# Patient Record
Sex: Male | Born: 1985 | Race: White | Hispanic: No | Marital: Single | Smoking: Never smoker
Health system: Southern US, Community
[De-identification: ages and names within clinical notes are randomized; demographics above are authoritative.]

## PROBLEM LIST (undated history)

## (undated) DIAGNOSIS — F4310 Post-traumatic stress disorder, unspecified: Secondary | ICD-10-CM

## (undated) DIAGNOSIS — F32A Depression, unspecified: Secondary | ICD-10-CM

## (undated) DIAGNOSIS — G43009 Migraine without aura, not intractable, without status migrainosus: Secondary | ICD-10-CM

## (undated) DIAGNOSIS — F909 Attention-deficit hyperactivity disorder, unspecified type: Secondary | ICD-10-CM

## (undated) DIAGNOSIS — F0781 Postconcussional syndrome: Secondary | ICD-10-CM

## (undated) HISTORY — PX: SINUSOTOMY: SHX291

## (undated) HISTORY — DX: Attention-deficit hyperactivity disorder, unspecified type: F90.9

## (undated) HISTORY — DX: Postconcussional syndrome: F07.81

## (undated) HISTORY — DX: Migraine without aura, not intractable, without status migrainosus: G43.009

---

## 2019-07-28 DIAGNOSIS — F0781 Postconcussional syndrome: Secondary | ICD-10-CM | POA: Insufficient documentation

## 2019-08-03 ENCOUNTER — Ambulatory Visit
Admission: RE | Admit: 2019-08-03 | Discharge: 2019-08-03 | Disposition: A | Discharge status: Home / Self Care | Payer: BC Managed Care – PPO | Source: Ambulatory Visit | Attending: Family Medicine | Admitting: Family Medicine

## 2019-08-03 ENCOUNTER — Ambulatory Visit
Admission: RE | Admit: 2019-08-03 | Discharge: 2019-08-03 | Disposition: A | Discharge status: Home / Self Care | Payer: BC Managed Care – PPO | Attending: Family Medicine | Admitting: Family Medicine

## 2019-08-03 ENCOUNTER — Other Ambulatory Visit: Payer: Self-pay

## 2019-08-03 ENCOUNTER — Other Ambulatory Visit: Payer: Self-pay | Admitting: Family Medicine

## 2019-08-03 DIAGNOSIS — S199XXD Unspecified injury of neck, subsequent encounter: Secondary | ICD-10-CM | POA: Diagnosis present

## 2019-08-05 ENCOUNTER — Other Ambulatory Visit: Payer: Self-pay | Admitting: Acute Care

## 2019-08-05 ENCOUNTER — Other Ambulatory Visit (HOSPITAL_COMMUNITY): Payer: Self-pay | Admitting: Acute Care

## 2019-08-05 DIAGNOSIS — M542 Cervicalgia: Secondary | ICD-10-CM

## 2019-08-05 DIAGNOSIS — F0781 Postconcussional syndrome: Secondary | ICD-10-CM

## 2019-08-17 ENCOUNTER — Other Ambulatory Visit: Payer: Self-pay

## 2019-08-17 ENCOUNTER — Ambulatory Visit
Admission: RE | Admit: 2019-08-17 | Discharge: 2019-08-17 | Disposition: A | Discharge status: Home / Self Care | Payer: BC Managed Care – PPO | Source: Ambulatory Visit | Attending: Acute Care | Admitting: Acute Care

## 2019-08-17 DIAGNOSIS — F0781 Postconcussional syndrome: Secondary | ICD-10-CM | POA: Diagnosis present

## 2019-08-17 DIAGNOSIS — M542 Cervicalgia: Secondary | ICD-10-CM | POA: Insufficient documentation

## 2019-08-22 ENCOUNTER — Ambulatory Visit: Payer: BC Managed Care – PPO

## 2019-08-24 ENCOUNTER — Ambulatory Visit: Payer: BC Managed Care – PPO | Attending: Neurology

## 2019-08-24 ENCOUNTER — Other Ambulatory Visit: Payer: Self-pay

## 2019-08-24 VITALS — BP 126/84 | HR 84

## 2019-08-24 DIAGNOSIS — R41840 Attention and concentration deficit: Secondary | ICD-10-CM | POA: Insufficient documentation

## 2019-08-24 DIAGNOSIS — R202 Paresthesia of skin: Secondary | ICD-10-CM | POA: Diagnosis present

## 2019-08-24 DIAGNOSIS — M542 Cervicalgia: Secondary | ICD-10-CM | POA: Diagnosis present

## 2019-08-25 NOTE — Therapy (Deleted)
[place] [facility] PHYSICAL AND SPORTS MEDICINE [address] [place], [address] Phone: [phone]   Fax:  [phone]  Physical Therapy Evaluation  Patient Details  Name: Carlos Stewart MRN: [id number] Date of Birth: September 06, 1986 Referring Provider (PT): Theora Master, MD   Encounter Date: 08/24/2019    No past medical history on file.  *** The histories are not reviewed yet. Please review them in the "History" navigator section and refresh this SmartLink.  There were no vitals filed for this visit.       [facility] PT Assessment - 08/25/19 0001      Assessment   Medical Diagnosis  Post-concussion syndrome    Referring Provider (PT)  Theora Master, MD    Onset Date/Surgical Date  07/19/19    Hand Dominance  Right    Prior Therapy  None      Precautions   Precautions  None      Restrictions   Weight Bearing Restrictions  No      Balance Screen   Has the patient fallen in the past 6 months  No    Has the patient had a decrease in activity level because of a fear of falling?   Yes    Is the patient reluctant to leave their home because of a fear of falling?   No      Home Public house manager residence      Prior Function   Level of Independence  Independent    Vocation  Student      Cognition   Overall Cognitive Status  Within Functional Limits for tasks assessed         PAIN Location: neck N/T: on cervical UPAs rotation R C7-C3, to "area between shoulder blades"  Severity: 0/10 Irritability: mild  Nature: NMSK Stage: subacute Stability: ISQ  Agg: prolonged walking, focus Ease: rest   POSTURE Elevated shoulders B   GAIT / MOBILITY / TRANSFERS WNL  FUNCTIONAL MOBILITY Walk tol: 6 min Squat:  Reach:  Sleep: no disturbance Stairs: OK Falls: OK  VITALS HR: 84 O2: 100 BP: 126/84   ROM / MMT   Cervical ROM     Flex 100%  Ext. 75%!  Side-bend R 75%!  Side-bend  L 75%!  Rotation R 75%!  Rotation L 100%  ! = Painful  NEUROLOGICAL Visual field: WNL Vergence: WNL Saccades: mild deficit R when looking L VOR: WNL VOR cancellation: deferred due to cervical pain with rotation  Note patient c/o impaired focus, "fogginess"  SCAT 5  Part 2: Symptoms: Headache: 2 Pressure: 1 Feeling slowed down: 2 "In a fog": 1 Difficulty concentrating: 1 # Symptoms 5 Severity: 7  Part 3: Cognitive Screening Orientation: 5/5 Immediate memory: 14/15 Concentration 4/5  Part 4: Neurological Screen Read aloud: Y Full pain-free cervical ROM: N Double vision: N Finger-to-nose: Y Tandem gait: Y Balance exam: hard floor, tennis shoes mBESS: 3 errors in SLS  Part 5: Delayed Recall 2/5   PAM C2 WNL C3-C7 rotation to R painful and provokes N/T to "area between shoulder blades".   SPECIAL TESTS Spurling's - Alar ligament -   PALPATION TTP 1+ over cervical paraspinals, splenius capitus, upper trapezius   OUTCOMES Treadmill stress test: Pt reports onset of symptoms after 6 min walking. Vitals remain stable throughout. mBESS: 27/30    TREATMENT  Provided pt education on relative rest, avoidance of screen time and alcohol. 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 Saccade exercise (horizontal) x 10    Patient will benefit from skilled therapeutic intervention in order to improve the following deficits and impairments:  Pain, Decreased mobility, Impaired sensation, Impaired vision/preception, Decreased activity tolerance, Dizziness  Visit Diagnosis: Cervicalgia  Attention and concentration deficit  Paresthesia of skin     Problem List There are no active problems to display for this patient.   Virgia Land 08/25/2019, 6:25 PM  [place] PHYSICAL AND SPORTS MEDICINE [address] [place], [address] Phone: [phone]   Fax:   [phone]  Name: Carlos Stewart MRN: [id number] Date of Birth: 02/20/1986
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
## 2019-08-31 ENCOUNTER — Ambulatory Visit: Payer: BC Managed Care – PPO

## 2019-08-31 ENCOUNTER — Other Ambulatory Visit: Payer: Self-pay

## 2019-08-31 VITALS — BP 130/96 | HR 115

## 2019-08-31 DIAGNOSIS — R202 Paresthesia of skin: Secondary | ICD-10-CM

## 2019-08-31 DIAGNOSIS — R41840 Attention and concentration deficit: Secondary | ICD-10-CM

## 2019-08-31 DIAGNOSIS — M542 Cervicalgia: Secondary | ICD-10-CM | POA: Diagnosis not present
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
## 2019-09-14 ENCOUNTER — Ambulatory Visit: Payer: BC Managed Care – PPO | Attending: Neurology

## 2019-09-14 ENCOUNTER — Other Ambulatory Visit: Payer: Self-pay

## 2019-09-14 DIAGNOSIS — M542 Cervicalgia: Secondary | ICD-10-CM | POA: Diagnosis present

## 2019-09-14 DIAGNOSIS — R202 Paresthesia of skin: Secondary | ICD-10-CM | POA: Diagnosis present

## 2019-09-14 DIAGNOSIS — R41840 Attention and concentration deficit: Secondary | ICD-10-CM | POA: Insufficient documentation
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
## 2019-09-16 ENCOUNTER — Other Ambulatory Visit: Payer: Self-pay | Admitting: Neurology

## 2019-09-16 DIAGNOSIS — F0781 Postconcussional syndrome: Secondary | ICD-10-CM

## 2019-09-20 ENCOUNTER — Other Ambulatory Visit: Payer: Self-pay | Admitting: Family Medicine

## 2019-09-20 DIAGNOSIS — M5412 Radiculopathy, cervical region: Secondary | ICD-10-CM

## 2019-09-20 DIAGNOSIS — M5030 Other cervical disc degeneration, unspecified cervical region: Secondary | ICD-10-CM

## 2019-09-21 ENCOUNTER — Ambulatory Visit: Payer: BC Managed Care – PPO

## 2019-09-21 ENCOUNTER — Other Ambulatory Visit: Payer: Self-pay

## 2019-09-21 DIAGNOSIS — R41840 Attention and concentration deficit: Secondary | ICD-10-CM

## 2019-09-21 DIAGNOSIS — M542 Cervicalgia: Secondary | ICD-10-CM

## 2019-09-21 DIAGNOSIS — R202 Paresthesia of skin: Secondary | ICD-10-CM
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
## 2019-09-25 ENCOUNTER — Ambulatory Visit
Admission: RE | Admit: 2019-09-25 | Discharge: 2019-09-25 | Disposition: A | Discharge status: Home / Self Care | Payer: BC Managed Care – PPO | Source: Ambulatory Visit | Attending: Neurology | Admitting: Neurology

## 2019-09-25 DIAGNOSIS — F0781 Postconcussional syndrome: Secondary | ICD-10-CM | POA: Diagnosis not present

## 2019-09-26 ENCOUNTER — Other Ambulatory Visit: Payer: Self-pay

## 2019-09-26 ENCOUNTER — Ambulatory Visit
Admission: RE | Admit: 2019-09-26 | Discharge: 2019-09-26 | Disposition: A | Discharge status: Home / Self Care | Payer: BC Managed Care – PPO | Source: Ambulatory Visit | Attending: Family Medicine | Admitting: Family Medicine

## 2019-09-26 DIAGNOSIS — M5030 Other cervical disc degeneration, unspecified cervical region: Secondary | ICD-10-CM

## 2019-09-26 DIAGNOSIS — M5412 Radiculopathy, cervical region: Secondary | ICD-10-CM

## 2019-09-26 MED ORDER — TRIAMCINOLONE ACETONIDE 40 MG/ML IJ SUSP (RADIOLOGY)
60.0000 mg | Freq: Once | INTRAMUSCULAR | Status: AC
  Administered 2019-09-26: 60 mg via EPIDURAL

## 2019-09-26 MED ORDER — IOPAMIDOL (ISOVUE-M 300) INJECTION 61%
1.0000 mL | Freq: Once | INTRAMUSCULAR | Status: AC | PRN
  Administered 2019-09-26: 1 mL via EPIDURAL

## 2019-09-26 NOTE — Discharge Instructions (Signed)

## 2019-09-27 ENCOUNTER — Ambulatory Visit: Payer: BC Managed Care – PPO

## 2019-09-27 VITALS — BP 132/96

## 2019-09-27 DIAGNOSIS — M542 Cervicalgia: Secondary | ICD-10-CM

## 2019-09-27 DIAGNOSIS — R41840 Attention and concentration deficit: Secondary | ICD-10-CM

## 2019-09-27 DIAGNOSIS — R202 Paresthesia of skin: Secondary | ICD-10-CM
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
## 2019-10-13 ENCOUNTER — Other Ambulatory Visit: Payer: Self-pay

## 2019-10-13 ENCOUNTER — Ambulatory Visit: Payer: BC Managed Care – PPO

## 2019-10-13 DIAGNOSIS — M542 Cervicalgia: Secondary | ICD-10-CM

## 2019-10-13 DIAGNOSIS — R41840 Attention and concentration deficit: Secondary | ICD-10-CM

## 2019-10-13 DIAGNOSIS — R202 Paresthesia of skin: Secondary | ICD-10-CM
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
## 2019-10-17 NOTE — Addendum Note (Signed)
Addended by: Bethanie Dicker on: 10/17/2019 11:53 AM   Modules accepted: Orders

## 2019-10-24 ENCOUNTER — Ambulatory Visit: Payer: BC Managed Care – PPO

## 2019-10-31 ENCOUNTER — Other Ambulatory Visit: Payer: Self-pay

## 2019-10-31 ENCOUNTER — Ambulatory Visit: Payer: BC Managed Care – PPO | Attending: Neurology

## 2019-10-31 DIAGNOSIS — R41840 Attention and concentration deficit: Secondary | ICD-10-CM | POA: Diagnosis present

## 2019-10-31 DIAGNOSIS — M542 Cervicalgia: Secondary | ICD-10-CM

## 2019-10-31 DIAGNOSIS — R202 Paresthesia of skin: Secondary | ICD-10-CM | POA: Diagnosis present
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
## 2019-11-02 ENCOUNTER — Other Ambulatory Visit (HOSPITAL_COMMUNITY): Payer: Self-pay | Admitting: Neurology

## 2019-11-02 ENCOUNTER — Other Ambulatory Visit: Payer: Self-pay | Admitting: Neurology

## 2019-11-02 DIAGNOSIS — R9089 Other abnormal findings on diagnostic imaging of central nervous system: Secondary | ICD-10-CM

## 2019-11-02 DIAGNOSIS — H532 Diplopia: Secondary | ICD-10-CM

## 2019-11-08 ENCOUNTER — Other Ambulatory Visit: Payer: Self-pay

## 2019-11-08 ENCOUNTER — Ambulatory Visit
Admission: RE | Admit: 2019-11-08 | Discharge: 2019-11-08 | Disposition: A | Discharge status: Home / Self Care | Payer: BC Managed Care – PPO | Source: Ambulatory Visit | Attending: Neurology | Admitting: Neurology

## 2019-11-08 DIAGNOSIS — H532 Diplopia: Secondary | ICD-10-CM

## 2019-11-08 DIAGNOSIS — R9089 Other abnormal findings on diagnostic imaging of central nervous system: Secondary | ICD-10-CM

## 2019-11-08 MED ORDER — GADOBUTROL 1 MMOL/ML IV SOLN
9.0000 mL | Freq: Once | INTRAVENOUS | Status: AC | PRN
  Administered 2019-11-08: 9 mL via INTRAVENOUS

## 2019-11-10 ENCOUNTER — Other Ambulatory Visit: Payer: Self-pay

## 2019-11-10 ENCOUNTER — Ambulatory Visit: Payer: BC Managed Care – PPO

## 2019-11-10 DIAGNOSIS — M542 Cervicalgia: Secondary | ICD-10-CM | POA: Diagnosis not present

## 2019-11-10 DIAGNOSIS — R41840 Attention and concentration deficit: Secondary | ICD-10-CM

## 2019-11-10 DIAGNOSIS — R202 Paresthesia of skin: Secondary | ICD-10-CM
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
## 2019-11-16 ENCOUNTER — Other Ambulatory Visit: Payer: Self-pay

## 2019-11-16 ENCOUNTER — Ambulatory Visit: Payer: BC Managed Care – PPO | Attending: Neurology

## 2019-11-16 DIAGNOSIS — R202 Paresthesia of skin: Secondary | ICD-10-CM | POA: Diagnosis present

## 2019-11-16 DIAGNOSIS — M542 Cervicalgia: Secondary | ICD-10-CM | POA: Diagnosis present

## 2019-11-16 DIAGNOSIS — R252 Cramp and spasm: Secondary | ICD-10-CM | POA: Insufficient documentation

## 2019-11-16 DIAGNOSIS — R41840 Attention and concentration deficit: Secondary | ICD-10-CM | POA: Insufficient documentation

## 2019-11-16 NOTE — Addendum Note (Signed)
Addended by: Bethanie Dicker on: 11/16/2019 07:20 PM   Modules accepted: Orders
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
## 2019-11-23 ENCOUNTER — Other Ambulatory Visit: Payer: Self-pay

## 2019-11-23 ENCOUNTER — Ambulatory Visit: Payer: BC Managed Care – PPO

## 2019-11-23 DIAGNOSIS — R41840 Attention and concentration deficit: Secondary | ICD-10-CM

## 2019-11-23 DIAGNOSIS — R202 Paresthesia of skin: Secondary | ICD-10-CM

## 2019-11-23 DIAGNOSIS — M542 Cervicalgia: Secondary | ICD-10-CM
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
## 2019-11-28 ENCOUNTER — Ambulatory Visit: Payer: BC Managed Care – PPO

## 2019-11-28 ENCOUNTER — Other Ambulatory Visit: Payer: Self-pay

## 2019-11-28 DIAGNOSIS — R41840 Attention and concentration deficit: Secondary | ICD-10-CM | POA: Diagnosis not present

## 2019-11-28 DIAGNOSIS — M542 Cervicalgia: Secondary | ICD-10-CM

## 2019-11-28 DIAGNOSIS — R202 Paresthesia of skin: Secondary | ICD-10-CM
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
## 2019-12-03 ENCOUNTER — Ambulatory Visit: Payer: BC Managed Care – PPO | Attending: Internal Medicine

## 2019-12-03 DIAGNOSIS — Z23 Encounter for immunization: Secondary | ICD-10-CM | POA: Insufficient documentation

## 2019-12-03 NOTE — Progress Notes (Signed)
   Covid-19 Vaccination Clinic  Name:  NUNZIO BANET    MRN: [id number] DOB: Jul 05, 1986  12/03/2019  Mr. Rabinovich was observed post Covid-19 immunization for 15 minutes without incidence. He was provided with Vaccine Information Sheet and instruction to access the V-Safe system.   Mr. Ayo was instructed to call 911 with any severe reactions post vaccine: Marland Kitchen Difficulty breathing  . Swelling of your face and throat  . A fast heartbeat  . A bad rash all over your body  . Dizziness and weakness    Immunizations Administered    Name Date Dose VIS Date Route   Pfizer COVID-19 Vaccine 12/03/2019  8:35 AM 0.3 mL 09/23/2019 Intramuscular   Manufacturer: [employer], [employer]   Lot: [id number]   NDC: [address]-8

## 2019-12-07 ENCOUNTER — Ambulatory Visit: Payer: BC Managed Care – PPO

## 2019-12-07 ENCOUNTER — Other Ambulatory Visit: Payer: Self-pay

## 2019-12-07 DIAGNOSIS — R41840 Attention and concentration deficit: Secondary | ICD-10-CM

## 2019-12-07 DIAGNOSIS — R252 Cramp and spasm: Secondary | ICD-10-CM
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
## 2019-12-12 ENCOUNTER — Other Ambulatory Visit: Payer: Self-pay

## 2019-12-12 ENCOUNTER — Ambulatory Visit: Payer: BC Managed Care – PPO | Attending: Neurology

## 2019-12-12 DIAGNOSIS — R252 Cramp and spasm: Secondary | ICD-10-CM | POA: Diagnosis present

## 2019-12-12 DIAGNOSIS — R42 Dizziness and giddiness: Secondary | ICD-10-CM | POA: Diagnosis present

## 2019-12-12 DIAGNOSIS — M542 Cervicalgia: Secondary | ICD-10-CM | POA: Diagnosis present

## 2019-12-12 DIAGNOSIS — R2681 Unsteadiness on feet: Secondary | ICD-10-CM | POA: Diagnosis present

## 2019-12-12 DIAGNOSIS — R41840 Attention and concentration deficit: Secondary | ICD-10-CM | POA: Diagnosis present
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
## 2019-12-14 ENCOUNTER — Ambulatory Visit: Payer: BC Managed Care – PPO

## 2019-12-19 ENCOUNTER — Ambulatory Visit: Payer: BC Managed Care – PPO

## 2019-12-19 ENCOUNTER — Other Ambulatory Visit: Payer: Self-pay

## 2019-12-19 DIAGNOSIS — R41840 Attention and concentration deficit: Secondary | ICD-10-CM

## 2019-12-19 DIAGNOSIS — R252 Cramp and spasm: Secondary | ICD-10-CM

## 2019-12-19 DIAGNOSIS — R2681 Unsteadiness on feet: Secondary | ICD-10-CM
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
## 2019-12-26 ENCOUNTER — Ambulatory Visit: Payer: BC Managed Care – PPO

## 2019-12-26 ENCOUNTER — Other Ambulatory Visit: Payer: Self-pay

## 2019-12-26 DIAGNOSIS — R42 Dizziness and giddiness: Secondary | ICD-10-CM

## 2019-12-26 DIAGNOSIS — R252 Cramp and spasm: Secondary | ICD-10-CM | POA: Diagnosis not present

## 2019-12-26 DIAGNOSIS — R2681 Unsteadiness on feet: Secondary | ICD-10-CM
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
## 2019-12-28 ENCOUNTER — Ambulatory Visit: Payer: BC Managed Care – PPO | Attending: Internal Medicine

## 2019-12-28 DIAGNOSIS — Z23 Encounter for immunization: Secondary | ICD-10-CM

## 2019-12-28 NOTE — Progress Notes (Signed)
   Covid-19 Vaccination Clinic  Name:  Carlos Stewart    MRN: [id number] DOB: 1986/09/07  12/28/2019  [name] was observed post Covid-19 immunization for 15 minutes without incident. He was provided with Vaccine Information Sheet and instruction to access the V-Safe system.   [name] was instructed to call 911 with any severe reactions post vaccine: Marland Kitchen Difficulty breathing  . Swelling of face and throat  . A fast heartbeat  . A bad rash all over body  . Dizziness and weakness   Immunizations Administered    Name Date Dose VIS Date Route   Pfizer COVID-19 Vaccine 12/28/2019  8:31 AM 0.3 mL 09/23/2019 Intramuscular   Manufacturer: [employer], [employer]   Lot: [id number]   NDC: [address]-2

## 2020-01-02 ENCOUNTER — Ambulatory Visit: Payer: BC Managed Care – PPO

## 2020-01-02 ENCOUNTER — Other Ambulatory Visit: Payer: Self-pay

## 2020-01-02 DIAGNOSIS — R42 Dizziness and giddiness: Secondary | ICD-10-CM

## 2020-01-02 DIAGNOSIS — R252 Cramp and spasm: Secondary | ICD-10-CM | POA: Diagnosis not present

## 2020-01-02 DIAGNOSIS — R2681 Unsteadiness on feet: Secondary | ICD-10-CM
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
## 2020-01-09 ENCOUNTER — Ambulatory Visit: Payer: BC Managed Care – PPO

## 2020-01-09 ENCOUNTER — Other Ambulatory Visit: Payer: Self-pay

## 2020-01-09 DIAGNOSIS — R2681 Unsteadiness on feet: Secondary | ICD-10-CM

## 2020-01-09 DIAGNOSIS — R252 Cramp and spasm: Secondary | ICD-10-CM | POA: Diagnosis not present

## 2020-01-09 DIAGNOSIS — R42 Dizziness and giddiness: Secondary | ICD-10-CM
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
## 2020-01-23 ENCOUNTER — Other Ambulatory Visit: Payer: Self-pay

## 2020-01-23 ENCOUNTER — Ambulatory Visit: Payer: BC Managed Care – PPO | Attending: Neurology

## 2020-01-23 DIAGNOSIS — R2681 Unsteadiness on feet: Secondary | ICD-10-CM

## 2020-01-23 DIAGNOSIS — R42 Dizziness and giddiness: Secondary | ICD-10-CM | POA: Diagnosis present

## 2020-01-23 DIAGNOSIS — R252 Cramp and spasm: Secondary | ICD-10-CM | POA: Diagnosis present
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
## 2020-02-07 ENCOUNTER — Ambulatory Visit: Payer: BC Managed Care – PPO

## 2020-02-07 ENCOUNTER — Other Ambulatory Visit: Payer: Self-pay

## 2020-02-07 DIAGNOSIS — R2681 Unsteadiness on feet: Secondary | ICD-10-CM

## 2020-02-07 DIAGNOSIS — R252 Cramp and spasm: Secondary | ICD-10-CM

## 2020-02-07 DIAGNOSIS — R42 Dizziness and giddiness: Secondary | ICD-10-CM | POA: Diagnosis not present
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
## 2020-02-20 ENCOUNTER — Ambulatory Visit: Payer: BC Managed Care – PPO | Attending: Neurology

## 2020-02-20 ENCOUNTER — Other Ambulatory Visit: Payer: Self-pay

## 2020-02-20 DIAGNOSIS — R42 Dizziness and giddiness: Secondary | ICD-10-CM | POA: Insufficient documentation

## 2020-02-20 DIAGNOSIS — M542 Cervicalgia: Secondary | ICD-10-CM | POA: Insufficient documentation

## 2020-02-20 DIAGNOSIS — R2681 Unsteadiness on feet: Secondary | ICD-10-CM | POA: Diagnosis present

## 2020-02-20 DIAGNOSIS — R41841 Cognitive communication deficit: Secondary | ICD-10-CM | POA: Diagnosis present
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
## 2020-02-29 ENCOUNTER — Ambulatory Visit: Payer: BC Managed Care – PPO | Admitting: Speech Pathology

## 2020-02-29 ENCOUNTER — Other Ambulatory Visit: Payer: Self-pay

## 2020-02-29 DIAGNOSIS — R42 Dizziness and giddiness: Secondary | ICD-10-CM | POA: Diagnosis not present

## 2020-02-29 DIAGNOSIS — R41841 Cognitive communication deficit: Secondary | ICD-10-CM

## 2020-03-01 ENCOUNTER — Encounter: Payer: Self-pay | Admitting: Speech Pathology

## 2020-03-01 ENCOUNTER — Other Ambulatory Visit: Payer: Self-pay

## 2020-03-01 NOTE — Therapy (Signed)
[facility] MAIN [facility] SERVICES [address] [place], [place], [address] Phone: [phone]   Fax:  [phone]  Speech Language Pathology Evaluation  Patient Details  Name: Carlos Stewart MRN: [id number] Date of Birth: May 29, 1986 Referring Provider (SLP): Dr. Malvin Johns   Encounter Date: 02/29/2020  End of Session - 03/01/20 1542    Visit Number  1    Number of Visits  17    Date for SLP Re-Evaluation  04/25/20    Authorization Type  [employer]    Authorization Time Period  Start 02/29/2020    Authorization - Visit Number  1    Authorization - Number of Visits  10    SLP Start Time  1000    SLP Stop Time   1045    SLP Time Calculation (min)  45 min    Activity Tolerance  Patient tolerated treatment well       History reviewed. No pertinent past medical history.  History reviewed. No pertinent surgical history.  There were no vitals filed for this visit.      SLP Evaluation [facility] - 03/01/20 0001      SLP Visit Information   SLP Received On  02/29/20    Referring Provider (SLP)  Dr. Malvin Johns    Onset Date  02/16/2020    Medical Diagnosis  Post-Concussion Syndrome      Subjective   Subjective  The patient is eager to heal from the concussion.    Patient/Family Stated Goal  Improved word finding, improved memory, improved reading comprehension      Pain Assessment   Currently in Pain?  No/denies      General Information   HPI  Carlos Stewart is a 34 year old man with post-concussion syndrome with residual visual disturbance, memory loss, and word finding deficits.      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      Cognition   Overall Cognitive Status  Impaired/Different from baseline      Auditory Comprehension   Overall Auditory Comprehension  Appears within functional limits for tasks assessed      Reading Comprehension   Reading Status  Impaired      Expression   Primary Mode of Expression  Verbal      Verbal Expression   Overall Verbal Expression  Impaired      Written Expression   Written Expression  Exceptions to [facility]      Oral Motor/Sensory Function   Overall Oral Motor/Sensory Function  Appears within functional limits for tasks assessed      Motor Speech   Overall Motor Speech  Appears within functional limits for tasks assessed      Standardized Assessments   Standardized Assessments   Montreal Cognitive Assessment (MOCA)       Montreal Cognitive Assessment (MOCA) Version: 8.1 Visuospatial/Executive Alternating trail making       1/1 Visuoconstruction Skills (copy 3-d design) 0/1 Draw a clock     3/3 Naming     3/3 Attention Forward digit span    0/1 Backward digit span    1/1 Vigilance     1/1 Serial 7's     3/3 Language  Verbal Fluency     1/1 Repetition     1/2 Abstraction     2/2 Delayed Recall    2/5  Memory Index Score  10/15 Orientation     6/6 TOTAL  24/30       Normal  ? 26/30   Additional Observations: The patient reports baseline ADD and mild dyslexia.  His reading has been affected- he has difficulty concentrating and learning new written information.  He is slower and less accurate with typing skills. Scored in the 80th percentile on the Word Fluency subtest from the [facility] Comprehensive Examination.  SLP Education - 03/01/20 1541    Education Details  Results, recommendations, POC    Person(s) Educated  Patient    Methods  Explanation    Comprehension  Verbalized understanding         SLP Long Term Goals - 03/01/20 1546      SLP LONG TERM GOAL #1   Title  The patient will complete word finding tasks with 80% accuracy and explain strategies to improve word finding.    Time  8    Period  Weeks    Status  New    Target Date  04/25/20      SLP LONG TERM GOAL #2   Title  The patient will complete complex memory activities with 80% accuracy.    Time  8    Period   Weeks    Status  New    Target Date  04/25/20      SLP LONG TERM GOAL #3   Title  The patient will demonstrate reading comprehension for complex written information using study strategies.    Time  8    Period  Weeks    Status  New    Target Date  04/25/20      SLP LONG TERM GOAL #4   Title  The patient will improve written output speed and accuracy using word processing program.    Time  8    Period  Weeks    Status  New    Target Date  04/25/20       Plan - 03/01/20 1544    Clinical Impression Statement  At 6 months post onset of concussion, the patient is presenting with mild cognitive communication deficits characterized by impairment of visuospatial skills, attention, word finding in conversation, and memory impairment.  The patient scored 24/30 on the [facility] Cognitive Assessment, which falls below the cut-off point of 27.  The word finding deficits appear to be secondary difficulty with attention/concentration vs. aphasia as he scored within the 80th percentile on the Word Fluency subtest from the [facility] Comprehensive Examination.  The patient reports that concentrating exacerbates his visual disturbance.  He is experiencing increased difficulty with reading comprehension and speed of written output.  He is currently a DPT graduate student and needs to be able to enter documentation in a timely and accurate manor.  The patient will benefit from skilled speech therapy for restorative and compensatory treatment of post-concussion symptoms.  Patient targets word finding, memory, reading comprehension, and writing.  The patient's symptoms appear to be significantly exacerbated by the visual disturbance and he would benefit from referral to a neuro-ophthalmologist.    Speech Therapy Frequency  2x / week    Duration  Other (comment)   8 weeks   Treatment/Interventions  Language facilitation;Cognitive reorganization;Patient/family education    Potential to Achieve Goals  Good     Potential Considerations  Ability to learn/carryover information;Family/community support;Pain level;Previous level of function;Cooperation/participation level;Severity of impairments;Medical prognosis    SLP Home Exercise Plan  Pt instructed to practice typing    Consulted and Agree with Plan of Care  Patient  Patient will benefit from skilled therapeutic intervention in order to improve the following deficits and impairments:   Cognitive communication deficit - Plan: SLP plan of care cert/re-cert    Problem List There are no problems to display for this patient.  [name], [place], Susie 03/01/2020, 3:50 PM  [place] MAIN [facility] SERVICES [address] [place], [place], [address] Phone: [phone]   Fax:  [phone]  Name: Carlos Stewart MRN: [id number] Date of Birth: 08/23/1986

## 2020-03-05 ENCOUNTER — Ambulatory Visit: Payer: BC Managed Care – PPO

## 2020-03-05 ENCOUNTER — Other Ambulatory Visit: Payer: Self-pay

## 2020-03-05 DIAGNOSIS — R42 Dizziness and giddiness: Secondary | ICD-10-CM | POA: Diagnosis not present

## 2020-03-05 DIAGNOSIS — M542 Cervicalgia: Secondary | ICD-10-CM

## 2020-03-05 DIAGNOSIS — R2681 Unsteadiness on feet: Secondary | ICD-10-CM

## 2020-03-06 ENCOUNTER — Encounter: Payer: BC Managed Care – PPO | Admitting: Speech Pathology
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
## 2020-03-07 ENCOUNTER — Other Ambulatory Visit: Payer: Self-pay

## 2020-03-07 ENCOUNTER — Ambulatory Visit: Payer: BC Managed Care – PPO | Admitting: Speech Pathology

## 2020-03-07 DIAGNOSIS — R42 Dizziness and giddiness: Secondary | ICD-10-CM | POA: Diagnosis not present

## 2020-03-07 DIAGNOSIS — R41841 Cognitive communication deficit: Secondary | ICD-10-CM

## 2020-03-08 ENCOUNTER — Encounter: Payer: Self-pay | Admitting: Speech Pathology

## 2020-03-08 NOTE — Therapy (Signed)
[place] MAIN [facility] SERVICES [address] [place], [place], [address] Phone: [phone]   Fax:  [phone]  Speech Language Pathology Treatment  Patient Details  Name: Carlos Stewart MRN: [id number] Date of Birth: [birth year] Referring Provider (SLP): Dr. Melrose Nakayama   Encounter Date: 03/07/2020  End of Session - 03/08/20 1104    Visit Number  2    Number of Visits  17    Date for SLP Re-Evaluation  04/25/20    Authorization Type  BCBS    Authorization Time Period  Start 02/29/2020    Authorization - Visit Number  2    Authorization - Number of Visits  10    SLP Start Time  1500    SLP Stop Time   1550    SLP Time Calculation (min)  50 min    Activity Tolerance  Patient tolerated treatment well       History reviewed. No pertinent past medical history.  History reviewed. No pertinent surgical history.  There were no vitals filed for this visit.  Subjective Assessment - 03/08/20 1103    Subjective  Patient shared that studies have been difficult. He was focused and determined to improve his study strategies.            ADULT SLP TREATMENT - 03/08/20 0001      General Information   Behavior/Cognition  Alert;Cooperative;Pleasant mood    HPI  Carlos Stewart is a 34 year old man with post-concussion syndrome with residual visual disturbance, memory loss, and word finding deficits.       Treatment Provided   Treatment provided  Cognitive-Linquistic      Pain Assessment   Pain Assessment  No/denies pain      Cognitive-Linquistic Treatment   Treatment focused on  Cognition;Aphasia    Skilled Treatment  MEMORY: Studied acronyms (f=4) and then recalled them to answer questions. Correctly answered 75% of questions. Generated acronyms given information. Recited facts (f=24) out loud to push info into long-term memory. Correctly answered 85% of questions. Patient noted that he knew the answers to most questions prior to the activity. WORD  FINDING: Filled out a semantic feature analysis diagram to generate attributes to a target object.      Assessment / Recommendations / Plan   Plan  Continue with current plan of care      Progression Toward Goals   Progression toward goals  Progressing toward goals       SLP Education - 03/08/20 1103    Education Details  Instructed patient on strategies for improving study techniques (keeping a journal, reciting things out loud, generating acronyms, pulling important info from lectures).    Person(s) Educated  Patient    Methods  Explanation    Comprehension  Verbalized understanding         SLP Long Term Goals - 03/01/20 1546      SLP LONG TERM GOAL #1   Title  The patient will complete word finding tasks with 80% accuracy and explain strategies to improve word finding.    Time  8    Period  Weeks    Status  New    Target Date  04/25/20      SLP LONG TERM GOAL #2   Title  The patient will complete complex memory activities with 80% accuracy.    Time  8    Period  Weeks    Status  New    Target Date  04/25/20  SLP LONG TERM GOAL #3   Title  The patient will demonstrate reading comprehension for complex written information using study strategies.    Time  8    Period  Weeks    Status  New    Target Date  04/25/20      SLP LONG TERM GOAL #4   Title  The patient will improve written output speed and accuracy using word processing program.    Time  8    Period  Weeks    Status  New    Target Date  04/25/20       Plan - 03/08/20 1105    Clinical Impression Statement  Patient is aware of the strategies that have worked well for him in the past. He had difficulty with word retrieval, but after a couple minutes thought of the word on his own. Eager to practice the strategies discussed throughout the week.    Speech Therapy Frequency  2x / week    Duration  Other (comment)    Treatment/Interventions  Language facilitation;Cognitive reorganization;Patient/family  education    Potential to Achieve Goals  Good    Potential Considerations  Ability to learn/carryover information;Family/community support;Pain level;Previous level of function;Cooperation/participation level;Severity of impairments;Medical prognosis    Consulted and Agree with Plan of Care  Patient       Patient will benefit from skilled therapeutic intervention in order to improve the following deficits and impairments:   Cognitive communication deficit    Problem List There are no problems to display for this patient.  Dollene Primrose, MS/CCC- SLP  Leandrew Koyanagi 03/08/2020, 11:06 AM  [place] [facility] MAIN [facility] SERVICES [address] [place], [place], [address] Phone: [phone]   Fax:  [phone]   Name: Carlos Stewart MRN: [id number] Date of Birth: 12/01/1985

## 2020-03-09 ENCOUNTER — Ambulatory Visit: Payer: BC Managed Care – PPO | Admitting: Speech Pathology

## 2020-03-09 ENCOUNTER — Encounter: Payer: Self-pay | Admitting: Speech Pathology

## 2020-03-09 ENCOUNTER — Other Ambulatory Visit: Payer: Self-pay

## 2020-03-09 DIAGNOSIS — R41841 Cognitive communication deficit: Secondary | ICD-10-CM

## 2020-03-09 DIAGNOSIS — R42 Dizziness and giddiness: Secondary | ICD-10-CM | POA: Diagnosis not present

## 2020-03-09 NOTE — Therapy (Signed)
[place] [facility] MAIN [facility] SERVICES [address] [place], [place], [address] Phone: [phone]   Fax:  [phone]  Speech Language Pathology Treatment  Patient Details  Name: Carlos Stewart MRN: [id number] Date of Birth: 1986/03/28 Referring Provider (SLP): Dr. Malvin Johns   Encounter Date: 03/09/2020  End of Session - 03/09/20 1114    Visit Number  3    Number of Visits  17    Date for SLP Re-Evaluation  04/25/20    Authorization Type  BCBS    Authorization Time Period  Start 02/29/2020    Authorization - Visit Number  3    Authorization - Number of Visits  10    SLP Start Time  1000    SLP Stop Time   1050    SLP Time Calculation (min)  50 min    Activity Tolerance  Patient tolerated treatment well       History reviewed. No pertinent past medical history.  History reviewed. No pertinent surgical history.  There were no vitals filed for this visit.  Subjective Assessment - 03/09/20 1113    Subjective  Eager to learn and develop study strategies. "My typing still feels very slow. I used to be able to type and comprehend at the same time."            ADULT SLP TREATMENT - 03/09/20 0001      General Information   Behavior/Cognition  Alert;Cooperative;Pleasant mood    HPI  Kriss Ishler is a 34 year old man with post-concussion syndrome with residual visual disturbance, memory loss, and word finding deficits.       Treatment Provided   Treatment provided  Cognitive-Linquistic      Pain Assessment   Pain Assessment  No/denies pain      Cognitive-Linquistic Treatment   Treatment focused on  Cognition;Aphasia    Skilled Treatment  MEMORY: Read phrases aloud and used imaging as a study strategy. Answered follow-up questions at 100% accuracy. Mentioned that coming up with mental images is one of his strengths. Typed notes from material read out loud. Needed to repeat 2/10 passages in order to note all important information. Completed 2  semantic feature analyses.      Assessment / Recommendations / Plan   Plan  Continue with current plan of care      Progression Toward Goals   Progression toward goals  Progressing toward goals       SLP Education - 03/09/20 1114    Education Details  Study strategies, practice typing will improve speed    Person(s) Educated  Patient    Methods  Explanation    Comprehension  Verbalized understanding         SLP Long Term Goals - 03/01/20 1546      SLP LONG TERM GOAL #1   Title  The patient will complete word finding tasks with 80% accuracy and explain strategies to improve word finding.    Time  8    Period  Weeks    Status  New    Target Date  04/25/20      SLP LONG TERM GOAL #2   Title  The patient will complete complex memory activities with 80% accuracy.    Time  8    Period  Weeks    Status  New    Target Date  04/25/20      SLP LONG TERM GOAL #3   Title  The patient will demonstrate reading comprehension for complex  written information using study strategies.    Time  8    Period  Weeks    Status  New    Target Date  04/25/20      SLP LONG TERM GOAL #4   Title  The patient will improve written output speed and accuracy using word processing program.    Time  8    Period  Weeks    Status  New    Target Date  04/25/20       Plan - 03/09/20 1115    Clinical Impression Statement  Patient is proficient in demonstrating short-term memory, especially when using the study strategies that are discussed. Has applied these strategies to his studies throughout the week. He is aware that he needs to practice typing at home to improve speed.    Speech Therapy Frequency  2x / week    Duration  Other (comment)    Treatment/Interventions  Language facilitation;Cognitive reorganization;Patient/family education    Potential to Achieve Goals  Good    Potential Considerations  Ability to learn/carryover information;Family/community support;Pain level;Previous level of  function;Cooperation/participation level;Severity of impairments;Medical prognosis    Consulted and Agree with Plan of Care  Patient       Patient will benefit from skilled therapeutic intervention in order to improve the following deficits and impairments:   Cognitive communication deficit    Problem List There are no problems to display for this patient.  [name], MS/CCC- SLP  Lou Miner 03/09/2020, 11:15 AM  [place] MAIN [facility] SERVICES [address] [place], [place], [address] Phone: [phone]   Fax:  [phone]   Name: Carlos Stewart MRN: [id number] Date of Birth: 04-23-1986

## 2020-03-13 ENCOUNTER — Other Ambulatory Visit: Payer: Self-pay

## 2020-03-13 ENCOUNTER — Ambulatory Visit: Payer: BC Managed Care – PPO | Attending: Neurology | Admitting: Speech Pathology

## 2020-03-13 DIAGNOSIS — R42 Dizziness and giddiness: Secondary | ICD-10-CM | POA: Diagnosis present

## 2020-03-13 DIAGNOSIS — R41841 Cognitive communication deficit: Secondary | ICD-10-CM | POA: Diagnosis present

## 2020-03-13 DIAGNOSIS — R2681 Unsteadiness on feet: Secondary | ICD-10-CM | POA: Diagnosis present

## 2020-03-13 DIAGNOSIS — M542 Cervicalgia: Secondary | ICD-10-CM | POA: Insufficient documentation

## 2020-03-13 DIAGNOSIS — R252 Cramp and spasm: Secondary | ICD-10-CM | POA: Insufficient documentation

## 2020-03-14 ENCOUNTER — Encounter: Payer: Self-pay | Admitting: Speech Pathology

## 2020-03-14 NOTE — Therapy (Signed)
[place] MAIN [facility] SERVICES [address] [place], [place], [address] Phone: [phone]   Fax:  [phone]  Speech Language Pathology Treatment  Patient Details  Name: Carlos Stewart MRN: [id number] Date of Birth: 12/01/1985 Referring Provider (SLP): Dr. Melrose Nakayama   Encounter Date: 03/13/2020  End of Session - 03/14/20 0912    Visit Number  4    Number of Visits  17    Date for SLP Re-Evaluation  04/25/20    Authorization Type  BCBS    Authorization Time Period  Start 02/29/2020    Authorization - Visit Number  4    Authorization - Number of Visits  10    SLP Start Time  0400    SLP Stop Time   6962    SLP Time Calculation (min)  45 min    Activity Tolerance  Patient tolerated treatment well       History reviewed. No pertinent past medical history.  History reviewed. No pertinent surgical history.  There were no vitals filed for this visit.  Subjective Assessment - 03/14/20 0912    Subjective  Patient was focused and actively participating. Patient expressed difficulty with word finding when he does not have notes in front of him to refer to.            ADULT SLP TREATMENT - 03/14/20 0001      General Information   Behavior/Cognition  Alert;Cooperative;Pleasant mood    HPI  Carlos Stewart is a 34 year old man with post-concussion syndrome with residual visual disturbance, memory loss, and word finding deficits.       Treatment Provided   Treatment provided  Cognitive-Linquistic      Pain Assessment   Pain Assessment  No/denies pain      Cognitive-Linquistic Treatment   Treatment focused on  Cognition;Aphasia    Skilled Treatment  WORD RETRIEVAL: Identified actions from short description at 100% independence, but said he had difficulty thinking of several of the words. Did same exercise with PT terms, answered 95% correctly. TYPING: Completed five typing exercises to establish baseline word per minute measure. Averaged 31.6  WPM with 90.6% accuracy. Typed notes from a passage read aloud for 5 minutes. Answer follow-up questions related to the passage at 75% accuracy.      Assessment / Recommendations / Plan   Plan  Continue with current plan of care      Progression Toward Goals   Progression toward goals  Progressing toward goals       SLP Education - 03/14/20 0912    Education Details  Continue practice to improve typing speed    Person(s) Educated  Patient         SLP Long Term Goals - 03/01/20 1546      SLP LONG TERM GOAL #1   Title  The patient will complete word finding tasks with 80% accuracy and explain strategies to improve word finding.    Time  8    Period  Weeks    Status  New    Target Date  04/25/20      SLP LONG TERM GOAL #2   Title  The patient will complete complex memory activities with 80% accuracy.    Time  8    Period  Weeks    Status  New    Target Date  04/25/20      SLP LONG TERM GOAL #3   Title  The patient will demonstrate reading comprehension for complex  written information using study strategies.    Time  8    Period  Weeks    Status  New    Target Date  04/25/20      SLP LONG TERM GOAL #4   Title  The patient will improve written output speed and accuracy using word processing program.    Time  8    Period  Weeks    Status  New    Target Date  04/25/20       Plan - 03/14/20 0913    Clinical Impression Statement  Patient demonstrated proficient word retrieval skills, but expressed that he would still like to increase his speed of thinking. Expressed that he has more difficulty finding words when he cant refer to notes in front of him. His baseline levels for typing speed in words per minute were recorded. Will continue to practice and measure progress.    Speech Therapy Frequency  2x / week    Duration  Other (comment)    Treatment/Interventions  Language facilitation;Cognitive reorganization;Patient/family education    Potential Considerations  Ability  to learn/carryover information;Family/community support;Pain level;Previous level of function;Cooperation/participation level;Severity of impairments;Medical prognosis    SLP Home Exercise Plan  Pt instructed to practice typing    Consulted and Agree with Plan of Care  Patient       Patient will benefit from skilled therapeutic intervention in order to improve the following deficits and impairments:   Cognitive communication deficit    Problem List There are no problems to display for this patient.   Cephus Slater, Student Intern 03/14/2020, 9:14 AM  [place] [facility] MAIN [facility] SERVICES [address] [place], [place], [address] Phone: [phone]   Fax:  [phone]   Name: Carlos Stewart MRN: [id number] Date of Birth: 06-Mar-1986

## 2020-03-20 ENCOUNTER — Encounter: Payer: Self-pay | Admitting: Speech Pathology

## 2020-03-20 ENCOUNTER — Other Ambulatory Visit: Payer: Self-pay

## 2020-03-20 ENCOUNTER — Ambulatory Visit: Payer: BC Managed Care – PPO | Admitting: Speech Pathology

## 2020-03-20 DIAGNOSIS — R41841 Cognitive communication deficit: Secondary | ICD-10-CM

## 2020-03-20 NOTE — Therapy (Signed)
[place] [facility] MAIN [facility] SERVICES [address] [place], [place], [address] Phone: [phone]   Fax:  [phone]  Speech Language Pathology Treatment  Patient Details  Name: Carlos Stewart MRN: [id number] Date of Birth: 07/06/1986 Referring Provider (SLP): Dr. Malvin Johns   Encounter Date: 03/20/2020  End of Session - 03/20/20 1711    Visit Number  5    Number of Visits  17    Date for SLP Re-Evaluation  04/25/20    Authorization Type  BCBS    Authorization Time Period  Start 02/29/2020    Authorization - Visit Number  5    Authorization - Number of Visits  10    SLP Start Time  0405    SLP Stop Time   0450    SLP Time Calculation (min)  45 min    Activity Tolerance  Patient tolerated treatment well       History reviewed. No pertinent past medical history.  History reviewed. No pertinent surgical history.  There were no vitals filed for this visit.  Subjective Assessment - 03/20/20 1711    Subjective  Focused, brought in work from classes            ADULT SLP TREATMENT - 03/20/20 0001      General Information   Behavior/Cognition  Alert;Cooperative;Pleasant mood    HPI  Carlos Stewart is a 34 year old man with post-concussion syndrome with residual visual disturbance, memory loss, and word finding deficits.       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 Expressed some difficulty with word finding and difficulty remembering complex names (e.g. decerebellate rigidity). Came up with strategies to associate terms with cues he will remember. Taught same concepts again at the end of session without notes and was able to retrieve words using the  strategies he came up with. Completed word deduction activity. Identified word being described given 3 clues in 95% of opportunities.      Assessment / Recommendations / Plan   Plan  Continue with current plan of care      Progression Toward Goals   Progression toward goals  Progressing toward goals       SLP Education - 03/20/20 1711    Education Details  Use study strategies to associate more complex words with common ideas that are easier to remember    Person(s) Educated  Patient    Methods  Explanation    Comprehension  Verbalized understanding         SLP Long Term Goals - 03/01/20 1546      SLP LONG TERM GOAL #1   Title  The patient will complete word finding tasks with 80% accuracy and explain strategies to improve word finding.    Time  8    Period  Weeks    Status  New    Target Date  04/25/20      SLP LONG TERM GOAL #2   Title  The patient will complete complex memory activities with 80% accuracy.    Time  8    Period  Weeks    Status  New    Target Date  04/25/20  SLP LONG TERM GOAL #3   Title  The patient will demonstrate reading comprehension for complex written information using study strategies.    Time  8    Period  Weeks    Status  New    Target Date  04/25/20      SLP LONG TERM GOAL #4   Title  The patient will improve written output speed and accuracy using word processing program.    Time  8    Period  Weeks    Status  New    Target Date  04/25/20       Plan - 03/20/20 1712    Clinical Impression Statement  Patient expressed that study strategies have been helping with remembering information throughout the week. He came up with one strategy on his own before session and 2 more during the session. Still expressing difficulty with some word finding. Will continue to build confidence and speaking and teaching concepts.    Speech Therapy Frequency  2x / week    Duration  Other (comment)    Treatment/Interventions  Language  facilitation;Cognitive reorganization;Patient/family education    Potential to Achieve Goals  Good    Potential Considerations  Ability to learn/carryover information;Family/community support;Pain level;Previous level of function;Cooperation/participation level;Severity of impairments;Medical prognosis       Patient will benefit from skilled therapeutic intervention in order to improve the following deficits and impairments:   Cognitive communication deficit    Problem List There are no problems to display for this patient.   Maylon Cos, Student Intern 03/20/2020, 5:12 PM  [place] MAIN [facility] SERVICES [address] [place], [place], [address] Phone: [phone]   Fax:  [phone]   Name: FILMORE MOLYNEUX MRN: [id number] Date of Birth: [birth year]

## 2020-03-21 ENCOUNTER — Ambulatory Visit: Payer: BC Managed Care – PPO

## 2020-03-22 ENCOUNTER — Ambulatory Visit: Payer: BC Managed Care – PPO | Admitting: Speech Pathology

## 2020-03-22 ENCOUNTER — Encounter: Payer: Self-pay | Admitting: Speech Pathology

## 2020-03-22 ENCOUNTER — Other Ambulatory Visit: Payer: Self-pay

## 2020-03-22 DIAGNOSIS — R41841 Cognitive communication deficit: Secondary | ICD-10-CM | POA: Diagnosis not present

## 2020-03-22 NOTE — Therapy (Signed)
[place] MAIN [facility] SERVICES [address] [place], [place], [address] Phone: [phone]   Fax:  [phone]  Speech Language Pathology Treatment  Patient Details  Name: Carlos Stewart MRN: [id number] Date of Birth: June 17, 1986 Referring Provider (SLP): Dr. Melrose Nakayama   Encounter Date: 03/22/2020   End of Session - 03/22/20 1623    Visit Number 6    Number of Visits 17    Date for SLP Re-Evaluation 04/25/20    Authorization Type BCBS    Authorization Time Period Start 02/29/2020    Authorization - Visit Number 6    Authorization - Number of Visits 10    SLP Start Time 0100    SLP Stop Time  0145    SLP Time Calculation (min) 45 min    Activity Tolerance Patient tolerated treatment well           History reviewed. No pertinent past medical history.  History reviewed. No pertinent surgical history.  There were no vitals filed for this visit.   Subjective Assessment - 03/22/20 1623    Subjective Patient noted he was feeling under the weather, still attentive.                 ADULT SLP TREATMENT - 03/22/20 0001      General Information   Behavior/Cognition Alert;Cooperative;Pleasant mood    HPI Carlos Stewart is a 33 year old man with post-concussion syndrome with residual visual disturbance, memory loss, and word finding deficits.       Treatment Provided   Treatment provided Cognitive-Linquistic      Pain Assessment   Pain Assessment No/denies pain      Cognitive-Linquistic Treatment   Treatment focused on Cognition;Aphasia    Skilled Treatment TYPING: Completed 5 word per minute tests. Scored an average of 28.5 WPM at 91% accuracy. Patient noted that he is not using his lateral 2 fingers for typing which is slowing him down. COMPREHENSION: Typed notes from short informative paragraphs read aloud. Able to identify the main idea at 85% accuracy and recalled details at 70% accuracy. EXPRESSION: Came up with complete sentences  given two unrelated words with mid cueing (reminding patient not to change the tense of the word).      Assessment / Recommendations / Plan   Plan Continue with current plan of care      Progression Toward Goals   Progression toward goals Progressing toward goals            SLP Education - 03/22/20 1623    Education Details Practice typing, use correct form    Person(s) Educated Patient    Methods Explanation    Comprehension Verbalized understanding              SLP Long Term Goals - 03/01/20 1546      SLP LONG TERM GOAL #1   Title The patient will complete word finding tasks with 80% accuracy and explain strategies to improve word finding.    Time 8    Period Weeks    Status New    Target Date 04/25/20      SLP LONG TERM GOAL #2   Title The patient will complete complex memory activities with 80% accuracy.    Time 8    Period Weeks    Status New    Target Date 04/25/20      SLP LONG TERM GOAL #3   Title The patient will demonstrate reading comprehension for complex written information using study  strategies.    Time 8    Period Weeks    Status New    Target Date 04/25/20      SLP LONG TERM GOAL #4   Title The patient will improve written output speed and accuracy using word processing program.    Time 8    Period Weeks    Status New    Target Date 04/25/20            Plan - 03/22/20 1624    Clinical Impression Statement Patient's typing speed was similar to the previous week. He was encouraged to practice at home and use the correct form to increase speed. He has difficulty following along with spoken information when the topic is quickly changed. Will continue to work on note taking skills and study strategies.    Speech Therapy Frequency 2x / week    Duration Other (comment)    Treatment/Interventions Language facilitation;Cognitive reorganization;Patient/family education    Potential to Achieve Goals Good    Potential Considerations Ability to  learn/carryover information;Family/community support;Pain level;Previous level of function;Cooperation/participation level;Severity of impairments;Medical prognosis           Patient will benefit from skilled therapeutic intervention in order to improve the following deficits and impairments:   Cognitive communication deficit    Problem List There are no problems to display for this patient.   Cephus Slater, Student Intern 03/22/2020, 4:24 PM  [place] [facility] MAIN [facility] SERVICES [address] [place], [place], [address] Phone: [phone]   Fax:  [phone]   Name: Carlos Stewart MRN: [id number] Date of Birth: July 26, 1986

## 2020-03-27 ENCOUNTER — Other Ambulatory Visit: Payer: Self-pay

## 2020-03-27 ENCOUNTER — Encounter: Payer: Self-pay | Admitting: Speech Pathology

## 2020-03-27 ENCOUNTER — Ambulatory Visit: Payer: BC Managed Care – PPO | Admitting: Speech Pathology

## 2020-03-27 DIAGNOSIS — R41841 Cognitive communication deficit: Secondary | ICD-10-CM | POA: Diagnosis not present

## 2020-03-27 NOTE — Therapy (Signed)
[place] MAIN [facility] SERVICES [address] [place], [place], [address] Phone: [phone]   Fax:  [phone]  Speech Language Pathology Treatment  Patient Details  Name: Carlos Stewart MRN: [id number] Date of Birth: 11-21-1985 Referring Provider (SLP): Dr. Melrose Nakayama   Encounter Date: 03/27/2020   End of Session - 03/27/20 1717    Visit Number 7    Number of Visits 17    Date for SLP Re-Evaluation 04/25/20    Authorization Type BCBS    Authorization Time Period Start 02/29/2020    Authorization - Visit Number 7    Authorization - Number of Visits 10    SLP Start Time 0400    SLP Stop Time  6962    SLP Time Calculation (min) 45 min    Activity Tolerance Patient tolerated treatment well           History reviewed. No pertinent past medical history.  History reviewed. No pertinent surgical history.  There were no vitals filed for this visit.   Subjective Assessment - 03/27/20 1717    Subjective "I feel like I am at a high school level compared to my classmates who are post grad"                 ADULT SLP TREATMENT - 03/27/20 0001      General Information   Behavior/Cognition Alert;Cooperative;Pleasant mood    HPI Carlos Stewart is a 34 year old man with post-concussion syndrome with residual visual disturbance, memory loss, and word finding deficits.       Treatment Provided   Treatment provided Cognitive-Linquistic      Pain Assessment   Pain Assessment No/denies pain      Cognitive-Linquistic Treatment   Treatment focused on Cognition;Aphasia    Skilled Treatment EXPRESSION: Completed word analogies at 95% independence. Typed out full phrase to work on speed and typing without looking. Used semantic feature analysis to describe target word. Therapist correctly guessed target word in 3/4 opportunities. MEMORY: Came up with sentences to relate a set of words. Recalled set of 2 words at 100% accuracy and set of 3 words at 95%  accuracy.      Assessment / Recommendations / Plan   Plan Continue with current plan of care      Progression Toward Goals   Progression toward goals Progressing toward goals            SLP Education - 03/27/20 1717    Education Details Practice typing, use the study strategies that work best for you    Person(s) Educated Patient    Methods Explanation    Comprehension Verbalized understanding              SLP Long Term Goals - 03/01/20 1546      SLP LONG TERM GOAL #1   Title The patient will complete word finding tasks with 80% accuracy and explain strategies to improve word finding.    Time 8    Period Weeks    Status New    Target Date 04/25/20      SLP LONG TERM GOAL #2   Title The patient will complete complex memory activities with 80% accuracy.    Time 8    Period Weeks    Status New    Target Date 04/25/20      SLP LONG TERM GOAL #3   Title The patient will demonstrate reading comprehension for complex written information using study strategies.    Time  8    Period Weeks    Status New    Target Date 04/25/20      SLP LONG TERM GOAL #4   Title The patient will improve written output speed and accuracy using word processing program.    Time 8    Period Weeks    Status New    Target Date 04/25/20            Plan - 03/27/20 1718    Clinical Impression Statement Patient is showing proficiency in memory activities that are done in therapy. Effectively uses the study strategies to remember simple words and concepts, but must carry these over into his graduate level studies. Will continue to work on word finding and typing speed.    Speech Therapy Frequency 2x / week    Duration Other (comment)    Treatment/Interventions Language facilitation;Cognitive reorganization;Patient/family education    Potential to Achieve Goals Good    Potential Considerations Ability to learn/carryover information;Family/community support;Pain level;Previous level of  function;Cooperation/participation level;Severity of impairments;Medical prognosis    SLP Home Exercise Plan Pt instructed to practice typing    Consulted and Agree with Plan of Care Patient           Patient will benefit from skilled therapeutic intervention in order to improve the following deficits and impairments:   Cognitive communication deficit    Problem List There are no problems to display for this patient.   Cephus Slater, Student Intern 03/27/2020, 5:18 PM  [place] [facility] MAIN [facility] SERVICES [address] [place], [place], [address] Phone: [phone]   Fax:  [phone]   Name: Carlos Stewart MRN: [id number] Date of Birth: [birth year]

## 2020-03-28 ENCOUNTER — Other Ambulatory Visit: Payer: Self-pay

## 2020-03-28 ENCOUNTER — Ambulatory Visit: Payer: BC Managed Care – PPO

## 2020-03-28 DIAGNOSIS — R252 Cramp and spasm: Secondary | ICD-10-CM

## 2020-03-28 DIAGNOSIS — R41841 Cognitive communication deficit: Secondary | ICD-10-CM

## 2020-03-28 DIAGNOSIS — R42 Dizziness and giddiness: Secondary | ICD-10-CM

## 2020-03-28 DIAGNOSIS — M542 Cervicalgia: Secondary | ICD-10-CM

## 2020-03-28 DIAGNOSIS — R2681 Unsteadiness on feet: Secondary | ICD-10-CM
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
## 2020-03-29 ENCOUNTER — Ambulatory Visit: Payer: BC Managed Care – PPO | Admitting: Speech Pathology

## 2020-03-29 ENCOUNTER — Encounter: Payer: Self-pay | Admitting: Speech Pathology

## 2020-03-29 DIAGNOSIS — R41841 Cognitive communication deficit: Secondary | ICD-10-CM

## 2020-03-29 NOTE — Therapy (Signed)
[place] [facility] MAIN [facility] SERVICES [address] [place], [place], [address] Phone: [phone]   Fax:  [phone]  Speech Language Pathology Treatment  Patient Details  Name: Carlos Stewart MRN: [id number] Date of Birth: [birth year] Referring Provider (SLP): Dr. Malvin Johns   Encounter Date: 03/29/2020   End of Session - 03/29/20 1407    Visit Number 8    Number of Visits 17    Date for SLP Re-Evaluation 04/25/20    Authorization Type BCBS    Authorization Time Period Start 02/29/2020    Authorization - Visit Number 8    Authorization - Number of Visits 10    SLP Start Time 0100    SLP Stop Time  0145    SLP Time Calculation (min) 45 min    Activity Tolerance Patient tolerated treatment well           History reviewed. No pertinent past medical history.  History reviewed. No pertinent surgical history.  There were no vitals filed for this visit.   Subjective Assessment - 03/29/20 1406    Subjective Willing to try new study strategies                 ADULT SLP TREATMENT - 03/29/20 0001      General Information   Behavior/Cognition Alert;Cooperative;Pleasant mood    HPI Carlos Stewart is a 34 year old man with post-concussion syndrome with residual visual disturbance, memory loss, and word finding deficits.       Treatment Provided   Treatment provided Cognitive-Linquistic      Pain Assessment   Pain Assessment No/denies pain      Cognitive-Linquistic Treatment   Treatment focused on Cognition;Aphasia    Skilled Treatment EXPRESSION: Came up with a list of 3 objects independently when given a category in 100% of opportunities. MEMORY: Created 5 visual maps based on information read aloud from a paragraph. Discussed using these maps as a way to study. TYPING: Completed word per minute typing tests with an average of 32 words per minute at 93% accuracy.      Assessment / Recommendations / Plan   Plan Continue with current plan of  care      Progression Toward Goals   Progression toward goals Progressing toward goals            SLP Education - 03/29/20 1406    Education Details Memory strategies    Person(s) Educated Patient    Methods Explanation;Demonstration    Comprehension Verbalized understanding;Returned demonstration              SLP Long Term Goals - 03/01/20 1546      SLP LONG TERM GOAL #1   Title The patient will complete word finding tasks with 80% accuracy and explain strategies to improve word finding.    Time 8    Period Weeks    Status New    Target Date 04/25/20      SLP LONG TERM GOAL #2   Title The patient will complete complex memory activities with 80% accuracy.    Time 8    Period Weeks    Status New    Target Date 04/25/20      SLP LONG TERM GOAL #3   Title The patient will demonstrate reading comprehension for complex written information using study strategies.    Time 8    Period Weeks    Status New    Target Date 04/25/20      SLP LONG  TERM GOAL #4   Title The patient will improve written output speed and accuracy using word processing program.    Time 8    Period Weeks    Status New    Target Date 04/25/20            Plan - 03/29/20 1407    Clinical Impression Statement Patient is showing slight improvement in typing speed and accuracy. Michela Pitcher he has been practicing at home. Continuing to introduce new study strategies. Will discuss which strategies to use in which situations. Mentioned that diplopia was improving until this week.    Speech Therapy Frequency 2x / week    Duration Other (comment)    Treatment/Interventions Language facilitation;Cognitive reorganization;Patient/family education    Potential to Achieve Goals Good    Potential Considerations Ability to learn/carryover information;Family/community support;Pain level;Previous level of function;Cooperation/participation level;Severity of impairments;Medical prognosis           Patient will  benefit from skilled therapeutic intervention in order to improve the following deficits and impairments:   Cognitive communication deficit    Problem List There are no problems to display for this patient.   Carlos Stewart, Student Intern 03/29/2020, 2:08 PM  [place] MAIN [facility] [address] [place], [place], [address] Phone: [phone]   Fax:  [phone]   Name: Carlos Stewart MRN: [id number] Date of Birth: 03-14-1986

## 2020-04-02 ENCOUNTER — Ambulatory Visit: Payer: BC Managed Care – PPO | Admitting: Speech Pathology

## 2020-04-02 ENCOUNTER — Other Ambulatory Visit: Payer: Self-pay

## 2020-04-02 ENCOUNTER — Encounter: Payer: Self-pay | Admitting: Speech Pathology

## 2020-04-02 ENCOUNTER — Ambulatory Visit: Payer: BC Managed Care – PPO

## 2020-04-02 DIAGNOSIS — R42 Dizziness and giddiness: Secondary | ICD-10-CM

## 2020-04-02 DIAGNOSIS — R41841 Cognitive communication deficit: Secondary | ICD-10-CM

## 2020-04-02 DIAGNOSIS — M542 Cervicalgia: Secondary | ICD-10-CM
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
## 2020-04-02 NOTE — Therapy (Signed)
[facility] MAIN [facility] SERVICES [address] [place], [place], [address] Phone: [phone]   Fax:  [phone]  Speech Language Pathology Treatment  Patient Details  Name: Carlos Stewart MRN: [id number] Date of Birth: 11-Dec-1985 Referring Provider (SLP): Dr. Malvin Johns   Encounter Date: 04/02/2020   End of Session - 04/02/20 1706    Visit Number 9    Number of Visits 17    Date for SLP Re-Evaluation 04/25/20    Authorization Type BCBS    Authorization Time Period Start 02/29/2020    Authorization - Visit Number 9    Authorization - Number of Visits 10    SLP Start Time 0300    SLP Stop Time  0350    SLP Time Calculation (min) 50 min    Activity Tolerance Patient tolerated treatment well           History reviewed. No pertinent past medical history.  History reviewed. No pertinent surgical history.  There were no vitals filed for this visit.   Subjective Assessment - 04/02/20 1706    Subjective Attentive, willing to participate                 ADULT SLP TREATMENT - 04/02/20 0001      General Information   Behavior/Cognition Alert;Cooperative;Pleasant mood    HPI Carlos Stewart is a 34 year old man with post-concussion syndrome with residual visual disturbance, memory loss, and word finding deficits.       Treatment Provided   Treatment provided Cognitive-Linquistic      Pain Assessment   Pain Assessment No/denies pain      Cognitive-Linquistic Treatment   Treatment focused on Cognition;Aphasia    Skilled Treatment EXPRESSION: Completed words associations worksheet independently at 95% accuracy. Completed semantic feature analysis for 3 terms independently. MEMORY: Created sentences using 3, 4, and 5 terms in a word lists. Remembered terms at 75% accuracy. COMPREHENSION: Listened to short paragraphs read aloud. Typed notes out on the computer and explained paragraph out loud coherently with mid cueing to elicit details.       Assessment / Recommendations / Plan   Plan Continue with current plan of care      Progression Toward Goals   Progression toward goals Progressing toward goals            SLP Education - 04/02/20 1706    Education Details Study/memory strategies    Person(s) Educated Patient    Methods Explanation    Comprehension Verbalized understanding              SLP Long Term Goals - 03/01/20 1546      SLP LONG TERM GOAL #1   Title The patient will complete word finding tasks with 80% accuracy and explain strategies to improve word finding.    Time 8    Period Weeks    Status New    Target Date 04/25/20      SLP LONG TERM GOAL #2   Title The patient will complete complex memory activities with 80% accuracy.    Time 8    Period Weeks    Status New    Target Date 04/25/20      SLP LONG TERM GOAL #3   Title The patient will demonstrate reading comprehension for complex written information using study strategies.    Time 8    Period Weeks    Status New    Target Date 04/25/20      SLP LONG TERM  GOAL #4   Title The patient will improve written output speed and accuracy using word processing program.    Time 8    Period Weeks    Status New    Target Date 04/25/20            Plan - 04/02/20 1706    Clinical Impression Statement Patient is expressing improvement in word finding skills. Completed semantic feature analysis with much more detail than last time. Continues to work on typing speed and fluency when speaking out loud.    Speech Therapy Frequency 2x / week    Duration Other (comment)    Treatment/Interventions Language facilitation;Cognitive reorganization;Patient/family education    Potential to Achieve Goals Good    Potential Considerations Ability to learn/carryover information;Family/community support;Pain level;Previous level of function;Cooperation/participation level;Severity of impairments;Medical prognosis           Patient will benefit from  skilled therapeutic intervention in order to improve the following deficits and impairments:   Cognitive communication deficit    Problem List There are no problems to display for this patient.   Maylon Cos, Student Intern 04/02/2020, 5:07 PM  [place] MAIN [facility] SERVICES [address] [place], [place], [address] Phone: [phone]   Fax:  [phone]   Name: Carlos Stewart MRN: [id number] Date of Birth: 26-Sep-1986

## 2020-04-03 ENCOUNTER — Encounter: Payer: BC Managed Care – PPO | Admitting: Speech Pathology

## 2020-04-04 ENCOUNTER — Encounter: Payer: Self-pay | Admitting: Speech Pathology

## 2020-04-04 ENCOUNTER — Ambulatory Visit: Payer: BC Managed Care – PPO | Admitting: Speech Pathology

## 2020-04-04 ENCOUNTER — Other Ambulatory Visit: Payer: Self-pay

## 2020-04-04 DIAGNOSIS — R41841 Cognitive communication deficit: Secondary | ICD-10-CM | POA: Diagnosis not present

## 2020-04-04 NOTE — Therapy (Signed)
[place] MAIN [facility] SERVICES [address] [place], [place], [address] Phone: [phone]   Fax:  [phone]  Speech Language Pathology Treatment  Speech Therapy Progress Note   Dates of reporting period  02/29/2020   to   04/04/2020   Patient Details  Name: CHRISTOPHERJOHN SCHIELE MRN: [id number] Date of Birth: 12-20-1985 Referring Provider (SLP): Dr. Melrose Nakayama   Encounter Date: 04/04/2020   End of Session - 04/04/20 1709    Visit Number 10    Number of Visits 17    Date for SLP Re-Evaluation 04/25/20    Authorization Type BCBS    Authorization Time Period Start 02/29/2020    Authorization - Visit Number 10    Authorization - Number of Visits 10    SLP Start Time 0300    SLP Stop Time  0350    SLP Time Calculation (min) 50 min    Activity Tolerance Patient tolerated treatment well           History reviewed. No pertinent past medical history.  History reviewed. No pertinent surgical history.  There were no vitals filed for this visit.   Subjective Assessment - 04/04/20 1709    Subjective Tired from exams, still attentive                   SLP Education - 04/04/20 1709    Education Details When to use which study strategies    Person(s) Educated Patient    Methods Explanation    Comprehension Verbalized understanding              SLP Long Term Goals - 04/05/20 0845      SLP LONG TERM GOAL #1   Title The patient will complete word finding tasks with 80% accuracy and explain strategies to improve word finding.    Status Partially Met    Target Date 04/25/20      SLP LONG TERM GOAL #2   Title The patient will complete complex memory activities with 80% accuracy.    Status Partially Met    Target Date 04/25/20      SLP LONG TERM GOAL #3   Title The patient will demonstrate reading comprehension for complex written information using study strategies.    Status Partially Met    Target Date 04/25/20      SLP LONG  TERM GOAL #4   Title The patient will improve written output speed and accuracy using word processing program.    Status Partially Met    Target Date 04/25/20            Plan - 04/04/20 1710    Clinical Impression Statement Patient reported that he used study strategies from therapy to study for final exams. He also reported better word retrieval skills when engaged in casual conversation with therapist versus thinking of categories for a worksheet. Typing is improving since the first session. Will continue to work on speed and accuracy.    Speech Therapy Frequency 2x / week    Duration Other (comment)    Treatment/Interventions Language facilitation;Cognitive reorganization;Patient/family education    Potential to Achieve Goals Good    Potential Considerations Ability to learn/carryover information;Family/community support;Pain level;Previous level of function;Cooperation/participation level;Severity of impairments;Medical prognosis           Patient will benefit from skilled therapeutic intervention in order to improve the following deficits and impairments:   Cognitive communication deficit    Problem List There are no problems to  display for this patient.   Lou Miner, Student Intern 04/05/2020, 8:46 AM  [place] MAIN [facility] SERVICES [address] [place], [place], [address] Phone: [phone]   Fax:  [phone]   Name: DEMANI MCBRIEN MRN: [id number] Date of Birth: September 04, 1986

## 2020-04-05 ENCOUNTER — Encounter: Payer: BC Managed Care – PPO | Admitting: Speech Pathology

## 2020-04-18 ENCOUNTER — Other Ambulatory Visit: Payer: Self-pay

## 2020-04-18 ENCOUNTER — Ambulatory Visit: Payer: BC Managed Care – PPO | Attending: Neurology

## 2020-04-18 DIAGNOSIS — M542 Cervicalgia: Secondary | ICD-10-CM | POA: Diagnosis present

## 2020-04-18 DIAGNOSIS — R41841 Cognitive communication deficit: Secondary | ICD-10-CM | POA: Diagnosis present

## 2020-04-18 DIAGNOSIS — R42 Dizziness and giddiness: Secondary | ICD-10-CM | POA: Insufficient documentation

## 2020-04-19 ENCOUNTER — Encounter: Payer: Self-pay | Admitting: Speech Pathology

## 2020-04-19 ENCOUNTER — Ambulatory Visit: Payer: BC Managed Care – PPO | Admitting: Speech Pathology

## 2020-04-19 DIAGNOSIS — R41841 Cognitive communication deficit: Secondary | ICD-10-CM

## 2020-04-19 NOTE — Therapy (Signed)
[place] MAIN [facility] SERVICES [address] [place], [place], [address] Phone: [phone]   Fax:  [phone]  Speech Language Pathology Treatment  Patient Details  Name: Carlos Stewart MRN: [id number] Date of Birth: September 26, 1986 Referring Provider (SLP): Dr. Melrose Nakayama   Encounter Date: 04/19/2020   End of Session - 04/19/20 1631    Visit Number 11    Number of Visits 17    Date for SLP Re-Evaluation 04/25/20    Authorization Type BCBS    Authorization Time Period Start 03/20/2020    Authorization - Visit Number 1    Authorization - Number of Visits 10    SLP Start Time 4403    SLP Stop Time  0350    SLP Time Calculation (min) 45 min    Activity Tolerance Patient tolerated treatment well           History reviewed. No pertinent past medical history.  History reviewed. No pertinent surgical history.  There were no vitals filed for this visit.   Subjective Assessment - 04/19/20 1631    Subjective "I'm not able to keep up like I was before"                 ADULT SLP TREATMENT - 04/19/20 0001      General Information   Behavior/Cognition Alert;Cooperative;Pleasant mood    HPI Carlos Stewart is a 34 year old man with post-concussion syndrome with residual visual disturbance, memory loss, and word finding deficits.       Treatment Provided   Treatment provided Cognitive-Linquistic      Pain Assessment   Pain Assessment No/denies pain      Cognitive-Linquistic Treatment   Treatment focused on Cognition;Aphasia    Skilled Treatment EXPRESSION: Added items to categories and named category (abstract) at 90% accuracy. Typed out answers to work on speed and accuracy. MEMORY: Able to recall 8/10 words after clinician read through the list once and gave cues ("a building" for church). Recalled 8/10 words again 10 minutes later with no cueing.      Assessment / Recommendations / Plan   Plan Continue with current plan of care       Progression Toward Goals   Progression toward goals Progressing toward goals            SLP Education - 04/19/20 1631    Education Details Memory strategies    Person(s) Educated Patient    Methods Explanation    Comprehension Verbalized understanding              SLP Long Term Goals - 04/05/20 0845      SLP LONG TERM GOAL #1   Title The patient will complete word finding tasks with 80% accuracy and explain strategies to improve word finding.    Status Partially Met    Target Date 04/25/20      SLP LONG TERM GOAL #2   Title The patient will complete complex memory activities with 80% accuracy.    Status Partially Met    Target Date 04/25/20      SLP LONG TERM GOAL #3   Title The patient will demonstrate reading comprehension for complex written information using study strategies.    Status Partially Met    Target Date 04/25/20      SLP LONG TERM GOAL #4   Title The patient will improve written output speed and accuracy using word processing program.    Status Partially Met    Target Date 04/25/20  Plan - 04/19/20 1632    Clinical Impression Statement Patient reported that he did not perform as well in graduate courses as he would have liked to. Continues to express issues with word finding, especially in stressful situations (had to give a presentation recently). He is showing proficiency in word finding when completing worksheets in a comfortable environment. Will continue to work on typing speed and speaking skills.    Speech Therapy Frequency 2x / week    Duration Other (comment)    Treatment/Interventions Language facilitation;Cognitive reorganization;Patient/family education    Potential to Achieve Goals Good    Potential Considerations Ability to learn/carryover information;Family/community support;Pain level;Previous level of function;Cooperation/participation level;Severity of impairments;Medical prognosis           Patient will benefit from  skilled therapeutic intervention in order to improve the following deficits and impairments:   Cognitive communication deficit    Problem List There are no problems to display for this patient.   Lou Miner, Student Intern 04/19/2020, 4:55 PM  [place] MAIN [facility] SERVICES [address] [place], [place], [address] Phone: [phone]   Fax:  [phone]   Name: Carlos Stewart MRN: [id number] Date of Birth: April 20, 1986

## 2020-04-19 NOTE — Addendum Note (Signed)
Addended by: Bethanie Dicker on: 04/19/2020 11:42 AM   Modules accepted: Orders
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
## 2020-04-23 ENCOUNTER — Other Ambulatory Visit: Payer: Self-pay

## 2020-04-23 ENCOUNTER — Ambulatory Visit: Payer: BC Managed Care – PPO | Admitting: Speech Pathology

## 2020-04-23 ENCOUNTER — Encounter: Payer: Self-pay | Admitting: Speech Pathology

## 2020-04-23 DIAGNOSIS — R41841 Cognitive communication deficit: Secondary | ICD-10-CM

## 2020-04-23 NOTE — Therapy (Signed)
[place] MAIN [facility] SERVICES [address] [place], [place], [address] Phone: [phone]   Fax:  [phone]  Speech Language Pathology Treatment  Patient Details  Name: DEAVEN URWIN MRN: [id number] Date of Birth: 10-02-1986 Referring Provider (SLP): Dr. Melrose Nakayama   Encounter Date: 04/23/2020   End of Session - 04/23/20 1553    Visit Number 12    Number of Visits 17    Date for SLP Re-Evaluation 04/25/20    Authorization Type BCBS    Authorization Time Period Start 03/20/2020    Authorization - Visit Number 2    Authorization - Number of Visits 10    SLP Start Time 0205    SLP Stop Time  7124    SLP Time Calculation (min) 45 min    Activity Tolerance Patient tolerated treatment well           History reviewed. No pertinent past medical history.  History reviewed. No pertinent surgical history.  There were no vitals filed for this visit.   Subjective Assessment - 04/23/20 1553    Subjective Focused, attentive                 ADULT SLP TREATMENT - 04/23/20 0001      General Information   Behavior/Cognition Alert;Cooperative;Pleasant mood    HPI Kenzie Flakes is a 34 year old man with post-concussion syndrome with residual visual disturbance, memory loss, and word finding deficits.       Treatment Provided   Treatment provided Cognitive-Linquistic      Pain Assessment   Pain Assessment No/denies pain      Cognitive-Linquistic Treatment   Treatment focused on Cognition;Aphasia    Skilled Treatment EXPRESSION: Came up with definitions to common words at 100% accuracy and typed them out to work on speed and accuracy. TYPING: Took word per minute typing tests; scored average of 32 WPM at 96% accuracy. MEMORY: Remembered list of 10 words with cues at 100% accuracy. Remembered same list 10 minutes later without cues at 90% accuracy. REASONING: Completed perplexor puzzle independently and accurately.      Assessment /  Recommendations / Plan   Plan Continue with current plan of care      Progression Toward Goals   Progression toward goals Progressing toward goals            SLP Education - 04/23/20 1553    Education Details Memory strategies    Person(s) Educated Patient    Methods Explanation    Comprehension Verbalized understanding              SLP Long Term Goals - 04/05/20 0845      SLP LONG TERM GOAL #1   Title The patient will complete word finding tasks with 80% accuracy and explain strategies to improve word finding.    Status Partially Met    Target Date 04/25/20      SLP LONG TERM GOAL #2   Title The patient will complete complex memory activities with 80% accuracy.    Status Partially Met    Target Date 04/25/20      SLP LONG TERM GOAL #3   Title The patient will demonstrate reading comprehension for complex written information using study strategies.    Status Partially Met    Target Date 04/25/20      SLP LONG TERM GOAL #4   Title The patient will improve written output speed and accuracy using word processing program.    Status Partially Met  Target Date 04/25/20            Plan - 04/23/20 1553    Clinical Impression Statement Patient is continuing to improve typing speed and accuracy. He is showing proficiency in memory skills, but says he has difficulty when the words or tasks are more complex. 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    Speech Therapy Frequency 2x / week    Duration Other (comment)    Treatment/Interventions Language facilitation;Cognitive reorganization;Patient/family education    Potential to Achieve Goals Good    Potential Considerations Ability to learn/carryover information;Family/community support;Pain level;Previous level of function;Cooperation/participation level;Severity of impairments;Medical prognosis           Patient will benefit from skilled therapeutic intervention in order to improve the following deficits and  impairments:   Cognitive communication deficit    Problem List There are no problems to display for this patient.   Maylon Cos, Student Intern 04/23/2020, 3:54 PM  [place] MAIN [facility] SERVICES [address] [place], [place], [address] Phone: [phone]   Fax:  [phone]   Name: JALAN FARISS MRN: [id number] Date of Birth: 1986-04-05

## 2020-04-25 ENCOUNTER — Other Ambulatory Visit: Payer: Self-pay

## 2020-04-25 ENCOUNTER — Encounter: Payer: Self-pay | Admitting: Speech Pathology

## 2020-04-25 ENCOUNTER — Ambulatory Visit: Payer: BC Managed Care – PPO | Admitting: Speech Pathology

## 2020-04-25 DIAGNOSIS — R41841 Cognitive communication deficit: Secondary | ICD-10-CM

## 2020-04-25 NOTE — Therapy (Signed)
[place] MAIN [facility] SERVICES [address] [place], [place], [address] Phone: [phone]   Fax:  [phone]  Speech Language Pathology Treatment  Patient Details  Name: Carlos Stewart MRN: [id number] Date of Birth: Jan 30, 1986 Referring Provider (SLP): Dr. Melrose Nakayama   Encounter Date: 04/25/2020   End of Session - 04/25/20 1656    Visit Number 13    Number of Visits 17    Date for SLP Re-Evaluation 04/25/20    Authorization Type BCBS    Authorization Time Period Start 03/20/2020    Authorization - Visit Number 3    Authorization - Number of Visits 10    SLP Start Time 0300    SLP Stop Time  0345    SLP Time Calculation (min) 45 min    Activity Tolerance Patient tolerated treatment well           History reviewed. No pertinent past medical history.  History reviewed. No pertinent surgical history.  There were no vitals filed for this visit.   Subjective Assessment - 04/25/20 1655    Subjective Appeared tired, more quiet than usual                 ADULT SLP TREATMENT - 04/25/20 0001      General Information   Behavior/Cognition Alert;Cooperative;Pleasant mood    HPI Carlos Stewart is a 34 year old man with post-concussion syndrome with residual visual disturbance, memory loss, and word finding deficits.       Treatment Provided   Treatment provided Cognitive-Linquistic      Pain Assessment   Pain Assessment No/denies pain      Cognitive-Linquistic Treatment   Treatment focused on Cognition;Aphasia    Skilled Treatment MEMORY: Completed list ordering activity (remembering 8 words in the correct order) at 80% accuracy. Played memory game involving matching pairs of cards. COMPREHENSION: Listened to paragraphs read aloud and answered follow-up comprehension questions at 100% accuracy.      Assessment / Recommendations / Plan   Plan Continue with current plan of care      Progression Toward Goals   Progression toward goals  Progressing toward goals            SLP Education - 04/25/20 1655    Education Details Memory strategies    Person(s) Educated Patient    Methods Explanation    Comprehension Verbalized understanding              SLP Long Term Goals - 04/05/20 0845      SLP LONG TERM GOAL #1   Title The patient will complete word finding tasks with 80% accuracy and explain strategies to improve word finding.    Status Partially Met    Target Date 04/25/20      SLP LONG TERM GOAL #2   Title The patient will complete complex memory activities with 80% accuracy.    Status Partially Met    Target Date 04/25/20      SLP LONG TERM GOAL #3   Title The patient will demonstrate reading comprehension for complex written information using study strategies.    Status Partially Met    Target Date 04/25/20      SLP LONG TERM GOAL #4   Title The patient will improve written output speed and accuracy using word processing program.    Status Partially Met    Target Date 04/25/20            Plan - 04/25/20 1656  Clinical Impression Statement Patient is showing proficiency in auditory comprehension skills. He has developed efficient strategies for remembering word lists (creating a story with the words). He continues to mention word finding difficulties, especially in situations where there is more pressure (e.g. school presentation).    Speech Therapy Frequency 2x / week    Duration Other (comment)    Treatment/Interventions Language facilitation;Cognitive reorganization;Patient/family education    Potential to Achieve Goals Good    Potential Considerations Ability to learn/carryover information;Family/community support;Pain level;Previous level of function;Cooperation/participation level;Severity of impairments;Medical prognosis           Patient will benefit from skilled therapeutic intervention in order to improve the following deficits and impairments:   Cognitive communication  deficit    Problem List There are no problems to display for this patient.   Maylon Cos, Student Intern 04/25/2020, 4:57 PM  [place] MAIN [facility] SERVICES [address] [place], [place], [address] Phone: [phone]   Fax:  [phone]   Name: Carlos Stewart MRN: [id number] Date of Birth: September 16, 1986

## 2020-04-30 ENCOUNTER — Other Ambulatory Visit: Payer: Self-pay

## 2020-04-30 ENCOUNTER — Ambulatory Visit: Payer: BC Managed Care – PPO

## 2020-04-30 DIAGNOSIS — R42 Dizziness and giddiness: Secondary | ICD-10-CM

## 2020-04-30 DIAGNOSIS — R41841 Cognitive communication deficit: Secondary | ICD-10-CM | POA: Diagnosis not present

## 2020-04-30 DIAGNOSIS — M542 Cervicalgia: Secondary | ICD-10-CM

## 2020-05-01 ENCOUNTER — Encounter: Payer: Self-pay | Admitting: Speech Pathology

## 2020-05-01 ENCOUNTER — Ambulatory Visit: Payer: BC Managed Care – PPO | Admitting: Speech Pathology

## 2020-05-01 DIAGNOSIS — R41841 Cognitive communication deficit: Secondary | ICD-10-CM
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
## 2020-05-01 NOTE — Therapy (Signed)
[place] MAIN [facility] SERVICES [address] [place], [place], [address] Phone: [phone]   Fax:  [phone]  Speech Language Pathology Treatment  Patient Details  Name: Carlos Stewart MRN: [id number] Date of Birth: 1986-05-14 Referring Provider (SLP): Dr. Melrose Nakayama   Encounter Date: 05/01/2020   End of Session - 05/01/20 1657    Visit Number 14    Number of Visits 17    Date for SLP Re-Evaluation 04/25/20    Authorization Type BCBS    Authorization Time Period Start 03/20/2020    Authorization - Visit Number 4    Authorization - Number of Visits 10    SLP Start Time 0400    SLP Stop Time  4628    SLP Time Calculation (min) 45 min    Activity Tolerance Patient tolerated treatment well           History reviewed. No pertinent past medical history.  History reviewed. No pertinent surgical history.  There were no vitals filed for this visit.   Subjective Assessment - 05/01/20 1656    Subjective Focused, attentive                 ADULT SLP TREATMENT - 05/01/20 0001      General Information   Behavior/Cognition Alert;Cooperative;Pleasant mood    HPI Carlos Stewart is a 34 year old man with post-concussion syndrome with residual visual disturbance, memory loss, and word finding deficits.       Treatment Provided   Treatment provided Cognitive-Linquistic      Pain Assessment   Pain Assessment No/denies pain      Cognitive-Linquistic Treatment   Treatment focused on Cognition;Aphasia    Skilled Treatment EXPRESSION: Completed semantic feature analysis with common objects as well as physical therapy terms. Correctly described: group 7/9; use 9/9; action 7/9; properties 9/9; location 9/9; association 9/9. Created stories using a group of 10 words in 3 different opportunities. TYPING: Word per minute tests; average: 33 WPM at 91% accuracy.      Assessment / Recommendations / Plan   Plan Continue with current plan of care       Progression Toward Goals   Progression toward goals Progressing toward goals            SLP Education - 05/01/20 1656    Education Details Semantic feature analysis    Person(s) Educated Patient    Methods Explanation    Comprehension Verbalized understanding              SLP Long Term Goals - 04/05/20 0845      SLP LONG TERM GOAL #1   Title The patient will complete word finding tasks with 80% accuracy and explain strategies to improve word finding.    Status Partially Met    Target Date 04/25/20      SLP LONG TERM GOAL #2   Title The patient will complete complex memory activities with 80% accuracy.    Status Partially Met    Target Date 04/25/20      SLP LONG TERM GOAL #3   Title The patient will demonstrate reading comprehension for complex written information using study strategies.    Status Partially Met    Target Date 04/25/20      SLP LONG TERM GOAL #4   Title The patient will improve written output speed and accuracy using word processing program.    Status Partially Met    Target Date 04/25/20  Plan - 05/01/20 1657    Clinical Impression Statement Patient wishes to continue therapy. He expresses having some difficulties with memory (e.g. forgetting to take medicine). Patient feels that his reading comprehension is slow and sometimes inaccurate. Speed/accuracy of typing is improving, but depends on complexity. Will continue to address all goals.    Speech Therapy Frequency 2x / week    Duration Other (comment)    Treatment/Interventions Language facilitation;Cognitive reorganization;Patient/family education    Potential to Achieve Goals Good    Potential Considerations Ability to learn/carryover information;Family/community support;Pain level;Previous level of function;Cooperation/participation level;Severity of impairments;Medical prognosis           Patient will benefit from skilled therapeutic intervention in order to improve the  following deficits and impairments:   Cognitive communication deficit    Problem List There are no problems to display for this patient.   Maylon Cos, Student Intern 05/01/2020, 4:57 PM  [place] MAIN [facility] SERVICES [address] [place], [place], [address] Phone: [phone]   Fax:  [phone]   Name: Carlos Stewart MRN: [id number] Date of Birth: 1986-07-23

## 2020-05-03 ENCOUNTER — Encounter: Payer: Self-pay | Admitting: Speech Pathology

## 2020-05-03 ENCOUNTER — Other Ambulatory Visit: Payer: Self-pay

## 2020-05-03 ENCOUNTER — Ambulatory Visit: Payer: BC Managed Care – PPO | Admitting: Speech Pathology

## 2020-05-03 DIAGNOSIS — R41841 Cognitive communication deficit: Secondary | ICD-10-CM | POA: Diagnosis not present

## 2020-05-03 NOTE — Therapy (Signed)
[place] MAIN [facility] SERVICES [address] [place], [place], [address] Phone: [phone]   Fax:  [phone]  Speech Language Pathology Treatment/Re-Certification   Patient Details  Name: Carlos Stewart MRN: [id number] Date of Birth: 01-Feb-1986 Referring Provider (SLP): Dr. Melrose Nakayama   Encounter Date: 05/03/2020   End of Session - 05/03/20 1336    Visit Number 15    Number of Visits 17    Date for SLP Re-Evaluation 04/25/20    Authorization Type BCBS    Authorization Time Period Start 03/20/2020    Authorization - Visit Number 5    Authorization - Number of Visits 10    SLP Start Time 1962    SLP Stop Time  0955    SLP Time Calculation (min) 45 min    Activity Tolerance Patient tolerated treatment well           History reviewed. No pertinent past medical history.  History reviewed. No pertinent surgical history.  There were no vitals filed for this visit.   Subjective Assessment - 05/03/20 1336    Subjective Focused, attentive                 ADULT SLP TREATMENT - 05/03/20 0001      General Information   Behavior/Cognition Alert;Cooperative;Pleasant mood    HPI Carlos Stewart is a 34 year old man with post-concussion syndrome with residual visual disturbance, memory loss, and word finding deficits.       Treatment Provided   Treatment provided Cognitive-Linquistic      Pain Assessment   Pain Assessment No/denies pain      Cognitive-Linquistic Treatment   Treatment focused on Cognition;Aphasia    Skilled Treatment EXPRESSION: Listed items in a category independently at 85% accuracy. Typed out definitions to common words at 90% accuracy. Created a short story using a list of 15 key words. Able to identify 12/15 of these words independently (f=30).      Assessment / Recommendations / Plan   Plan Continue with current plan of care      Progression Toward Goals   Progression toward goals Progressing toward goals             SLP Education - 05/03/20 1336    Education Details Word finding strategies    Person(s) Educated Patient    Methods Explanation    Comprehension Verbalized understanding              SLP Long Term Goals - 05/03/20 1409      SLP LONG TERM GOAL #1   Title The patient will complete word finding tasks with 80% accuracy and explain strategies to improve word finding.    Time 8    Period Weeks    Status Partially Met    Target Date 06/28/20      SLP LONG TERM GOAL #2   Title The patient will complete complex memory activities with 80% accuracy.    Time 8    Period Weeks    Status Partially Met    Target Date 06/28/20      SLP LONG TERM GOAL #3   Title The patient will demonstrate reading comprehension for complex written information using study strategies.    Time 8    Period Weeks    Status Partially Met    Target Date 06/28/20      SLP LONG TERM GOAL #4   Title The patient will improve written output speed and accuracy using word processing program.  Time 8    Period Weeks    Status Partially Met    Target Date 06/28/20            Plan - 05/03/20 1336    Clinical Impression Statement Patient continues to improve in word finding abilities. He is also utilizing memory strategies in a variety of different tasks. Will work on tasks of higher complexity in future sessions.    Speech Therapy Frequency 2x / week    Duration Other (comment)    Treatment/Interventions Language facilitation;Cognitive reorganization;Patient/family education    Potential to Achieve Goals Good    Potential Considerations Ability to learn/carryover information;Family/community support;Pain level;Previous level of function;Cooperation/participation level;Severity of impairments;Medical prognosis           Patient will benefit from skilled therapeutic intervention in order to improve the following deficits and impairments:   Cognitive communication deficit - Plan: SLP plan of care  cert/re-cert    Problem List There are no problems to display for this patient.   Lou Miner, Student Intern 05/03/2020, 2:13 PM  [place] MAIN [facility] SERVICES [address] [place], [place], [address] Phone: [phone]   Fax:  [phone]   Name: Carlos Stewart MRN: [id number] Date of Birth: [birth year]

## 2020-05-07 ENCOUNTER — Ambulatory Visit: Payer: BC Managed Care – PPO | Admitting: Speech Pathology

## 2020-05-07 ENCOUNTER — Other Ambulatory Visit: Payer: Self-pay

## 2020-05-07 ENCOUNTER — Encounter: Payer: Self-pay | Admitting: Speech Pathology

## 2020-05-07 DIAGNOSIS — R41841 Cognitive communication deficit: Secondary | ICD-10-CM

## 2020-05-07 NOTE — Therapy (Signed)
[place] MAIN [facility] SERVICES [address] [place], [place], [address] Phone: [phone]   Fax:  [phone]  Speech Language Pathology Treatment  Patient Details  Name: Carlos Stewart MRN: [id number] Date of Birth: 1986-09-12 Referring Provider (SLP): Dr. Melrose Nakayama   Encounter Date: 05/07/2020   End of Session - 05/07/20 1512    Visit Number 16    Number of Visits 31    Date for SLP Re-Evaluation 06/28/20    Authorization Type BCBS    Authorization Time Period Start 03/20/2020    Authorization - Visit Number 6    Authorization - Number of Visits 10    SLP Start Time 0210    SLP Stop Time  3235    SLP Time Calculation (min) 45 min    Activity Tolerance Patient tolerated treatment well           History reviewed. No pertinent past medical history.  History reviewed. No pertinent surgical history.  There were no vitals filed for this visit.   Subjective Assessment - 05/07/20 1511    Subjective Focused, motivated                 ADULT SLP TREATMENT - 05/07/20 0001      General Information   Behavior/Cognition Alert;Cooperative;Pleasant mood    HPI Carlos Stewart is a 34 year old man with post-concussion syndrome with residual visual disturbance, memory loss, and word finding deficits.       Treatment Provided   Treatment provided Cognitive-Linquistic      Pain Assessment   Pain Assessment No/denies pain      Cognitive-Linquistic Treatment   Treatment focused on Cognition;Aphasia    Skilled Treatment EXPRESSION: Came up with definitions of complex words based on context of the sentence at 85% accuracy. Completed typing tests at an average of 33 words per minute and 97% accuracy. Completed semantic feature analysis independently for several terms relating to physical therapy.      Assessment / Recommendations / Plan   Plan Continue with current plan of care      Progression Toward Goals   Progression toward goals  Progressing toward goals            SLP Education - 05/07/20 1512    Education Details Context clues    Person(s) Educated Patient    Methods Explanation    Comprehension Verbalized understanding              SLP Long Term Goals - 05/03/20 1409      SLP LONG TERM GOAL #1   Title The patient will complete word finding tasks with 80% accuracy and explain strategies to improve word finding.    Time 8    Period Weeks    Status Partially Met    Target Date 06/28/20      SLP LONG TERM GOAL #2   Title The patient will complete complex memory activities with 80% accuracy.    Time 8    Period Weeks    Status Partially Met    Target Date 06/28/20      SLP LONG TERM GOAL #3   Title The patient will demonstrate reading comprehension for complex written information using study strategies.    Time 8    Period Weeks    Status Partially Met    Target Date 06/28/20      SLP LONG TERM GOAL #4   Title The patient will improve written output speed and accuracy using word  processing program.    Time 8    Period Weeks    Status Partially Met    Target Date 06/28/20            Plan - 05/07/20 1512    Clinical Impression Statement Patient is improving in his ability to complete semantic feature analyses for both common words as well as more complex physical therapy terms. He is also showing some improvement in typing speed. Needs to continue practicing at home to see significant progress.    Speech Therapy Frequency 2x / week    Duration Other (comment)    Treatment/Interventions Language facilitation;Cognitive reorganization;Patient/family education    Potential to Achieve Goals Good    Potential Considerations Ability to learn/carryover information;Family/community support;Pain level;Previous level of function;Cooperation/participation level;Severity of impairments;Medical prognosis           Patient will benefit from skilled therapeutic intervention in order to improve the  following deficits and impairments:   Cognitive communication deficit    Problem List There are no problems to display for this patient.   Lou Miner, Student Intern 05/07/2020, 3:31 PM  [place] MAIN [facility] SERVICES [address] [place], [place], [address] Phone: [phone]   Fax:  [phone]   Name: Carlos Stewart MRN: [id number] Date of Birth: [birth year]

## 2020-05-09 ENCOUNTER — Encounter: Payer: Self-pay | Admitting: Speech Pathology

## 2020-05-09 ENCOUNTER — Ambulatory Visit: Payer: BC Managed Care – PPO | Admitting: Speech Pathology

## 2020-05-09 ENCOUNTER — Other Ambulatory Visit: Payer: Self-pay

## 2020-05-09 DIAGNOSIS — R41841 Cognitive communication deficit: Secondary | ICD-10-CM | POA: Diagnosis not present

## 2020-05-09 NOTE — Therapy (Signed)
[place] MA[facility] SERVICES [address] [place], [place], [address] Phone: [phone]   Fax:  [phone]  Speech Language Pathology Treatment  Patient Details  Name: Carlos Stewart MRN: [id number] Date of Birth: 09/20/1986 Referring Provider (SLP): Dr. Melrose Nakayama   Encounter Date: 05/09/2020   End of Session - 05/09/20 1508    Visit Number 17    Number of Visits 31    Date for SLP Re-Evaluation 06/28/20    Authorization Type BCBS    Authorization Time Period Start 03/20/2020    Authorization - Visit Number 7    Authorization - Number of Visits 10    SLP Start Time 0205    SLP Stop Time  5427    SLP Time Calculation (min) 45 min    Activity Tolerance Patient tolerated treatment well           History reviewed. No pertinent past medical history.  History reviewed. No pertinent surgical history.  There were no vitals filed for this visit.   Subjective Assessment - 05/09/20 1508    Subjective Focused, willing to participate                 ADULT SLP TREATMENT - 05/09/20 0001      General Information   Behavior/Cognition Alert;Cooperative;Pleasant mood    HPI Carlos Stewart is a 34 year old man with post-concussion syndrome with residual visual disturbance, memory loss, and word finding deficits.       Treatment Provided   Treatment provided Cognitive-Linquistic      Pain Assessment   Pain Assessment No/denies pain      Cognitive-Linquistic Treatment   Treatment focused on Cognition;Aphasia    Skilled Treatment COMPREHENSION: Listened to paragraphs read aloud (advanced level) and typed notes on the computer. Answered follow-up comprehension questions at 85% accuracy. EXPRESSION: Completed semantic feature analyses for both common words and physical therapy terms. Able to list at least 2 items in each category with mid cueing.      Assessment / Recommendations / Plan   Plan Continue with current plan of care       Progression Toward Goals   Progression toward goals Progressing toward goals            SLP Education - 05/09/20 1508    Education Details Semantic feature analysis    Person(s) Educated Patient    Methods Explanation    Comprehension Verbalized understanding              SLP Long Term Goals - 05/03/20 1409      SLP LONG TERM GOAL #1   Title The patient will complete word finding tasks with 80% accuracy and explain strategies to improve word finding.    Time 8    Period Weeks    Status Partially Met    Target Date 06/28/20      SLP LONG TERM GOAL #2   Title The patient will complete complex memory activities with 80% accuracy.    Time 8    Period Weeks    Status Partially Met    Target Date 06/28/20      SLP LONG TERM GOAL #3   Title The patient will demonstrate reading comprehension for complex written information using study strategies.    Time 8    Period Weeks    Status Partially Met    Target Date 06/28/20      SLP LONG TERM GOAL #4   Title The patient will improve  written output speed and accuracy using word processing program.    Time 8    Period Weeks    Status Partially Met    Target Date 06/28/20            Plan - 05/09/20 1509    Clinical Impression Statement Patient continues to work hard during sessions and at home. He has downloaded some apps to help with word finding outside of treatment. Patient shows understanding of semantic feature analysis and is able to fill in all categories if given enough time. Noted that speed of processing is still an area of difficulty.    Speech Therapy Frequency 2x / week    Duration Other (comment)    Treatment/Interventions Language facilitation;Cognitive reorganization;Patient/family education    Potential to Achieve Goals Good    Potential Considerations Ability to learn/carryover information;Family/community support;Pain level;Previous level of function;Cooperation/participation level;Severity of  impairments;Medical prognosis    Consulted and Agree with Plan of Care Patient           Patient will benefit from skilled therapeutic intervention in order to improve the following deficits and impairments:   Cognitive communication deficit    Problem List There are no problems to display for this patient.   Maylon Cos, Student Intern 05/09/2020, 3:09 PM  [place] MAIN [facility] SERVICES [address] [place], [place], [address] Phone: [phone]   Fax:  [phone]   Name: Carlos Stewart MRN: [id number] Date of Birth: 01-Mar-1986

## 2020-05-16 ENCOUNTER — Ambulatory Visit: Payer: BC Managed Care – PPO | Admitting: Speech Pathology

## 2020-05-16 ENCOUNTER — Ambulatory Visit: Payer: BC Managed Care – PPO | Attending: Neurology

## 2020-05-16 ENCOUNTER — Other Ambulatory Visit: Payer: Self-pay

## 2020-05-16 DIAGNOSIS — R41841 Cognitive communication deficit: Secondary | ICD-10-CM

## 2020-05-16 DIAGNOSIS — R42 Dizziness and giddiness: Secondary | ICD-10-CM | POA: Diagnosis not present

## 2020-05-16 DIAGNOSIS — M542 Cervicalgia: Secondary | ICD-10-CM
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
## 2020-05-17 ENCOUNTER — Ambulatory Visit: Payer: BC Managed Care – PPO | Admitting: Speech Pathology

## 2020-05-17 ENCOUNTER — Encounter: Payer: Self-pay | Admitting: Speech Pathology

## 2020-05-17 NOTE — Therapy (Signed)
[place] MAIN [facility] SERVICES [address] [place], [place], [address] Phone: [phone]   Fax:  [phone]  Speech Language Pathology Treatment  Patient Details  Name: Carlos Stewart MRN: [id number] Date of Birth: 1986-04-14 Referring Provider (SLP): Dr. Melrose Nakayama   Encounter Date: 05/16/2020   End of Session - 05/17/20 0842    Visit Number 18    Number of Visits 31    Date for SLP Re-Evaluation 06/28/20    Authorization Type BCBS    Authorization Time Period Start 03/20/2020    Authorization - Visit Number 8    Progress Note Due on Visit 10    SLP Start Time 1400    SLP Stop Time  1450    SLP Time Calculation (min) 50 min    Activity Tolerance Patient tolerated treatment well           History reviewed. No pertinent past medical history.  History reviewed. No pertinent surgical history.  There were no vitals filed for this visit.   Subjective Assessment - 05/17/20 0841    Subjective Focused, willing to participate                 ADULT SLP TREATMENT - 05/17/20 0001      General Information   Behavior/Cognition Alert;Cooperative;Pleasant mood    HPI Yohann Curl is a 34 year old man with post-concussion syndrome with residual visual disturbance, memory loss, and word finding deficits.       Treatment Provided   Treatment provided Cognitive-Linquistic      Pain Assessment   Pain Assessment No/denies pain      Cognitive-Linquistic Treatment   Treatment focused on Cognition;Aphasia    Skilled Treatment VISION: Patient reports that he has seen a neuro- ophthalmologist and was told that his eyes are not tracking together. He is to get prism glasses that should make visual attention easier. WORD-FINDING: Completed 10 semantic feature analysis independently.  Given prompts, patient able to increase flexibility and creativity of responses.      Assessment / Recommendations / Plan   Plan Continue with current plan of care       Progression Toward Goals   Progression toward goals Progressing toward goals            SLP Education - 05/17/20 0841    Education Details Rationale for Semantic Feature Analysis    Person(s) Educated Patient    Methods Explanation    Comprehension Verbalized understanding              SLP Long Term Goals - 05/03/20 1409      SLP LONG TERM GOAL #1   Title The patient will complete word finding tasks with 80% accuracy and explain strategies to improve word finding.    Time 8    Period Weeks    Status Partially Met    Target Date 06/28/20      SLP LONG TERM GOAL #2   Title The patient will complete complex memory activities with 80% accuracy.    Time 8    Period Weeks    Status Partially Met    Target Date 06/28/20      SLP LONG TERM GOAL #3   Title The patient will demonstrate reading comprehension for complex written information using study strategies.    Time 8    Period Weeks    Status Partially Met    Target Date 06/28/20      SLP LONG TERM GOAL #4  Title The patient will improve written output speed and accuracy using word processing program.    Time 8    Period Weeks    Status Partially Met    Target Date 06/28/20            Plan - 05/17/20 0843    Clinical Impression Statement Patient continues to work hard during sessions and at home. Patient shows understanding of semantic feature analysis and is able to fill in all feature sections if given enough time. Noted that speed of processing is still an area of difficulty.    Speech Therapy Frequency 2x / week    Duration Other (comment)    Treatment/Interventions Language facilitation;Cognitive reorganization;Patient/family education    Potential to Achieve Goals Good    Potential Considerations Ability to learn/carryover information;Family/community support;Pain level;Previous level of function;Cooperation/participation level;Severity of impairments;Medical prognosis    Consulted and Agree with  Plan of Care Patient           Patient will benefit from skilled therapeutic intervention in order to improve the following deficits and impairments:   Cognitive communication deficit    Problem List There are no problems to display for this patient.  [name], MS/CCC-SLP  *During this treatment session, the therapist was present, participating in and directing the entire treatment time.  Lou Miner 05/17/2020, 8:45 AM  [place] MAIN [facility] SERVICES [address] [place], [place], [address] Phone: [phone]   Fax:  [phone]   Name: ARMISTEAD SULT MRN: [id number] Date of Birth: [birth year]

## 2020-05-22 ENCOUNTER — Ambulatory Visit: Payer: BC Managed Care – PPO | Admitting: Speech Pathology

## 2020-05-22 ENCOUNTER — Other Ambulatory Visit: Payer: Self-pay

## 2020-05-22 DIAGNOSIS — R42 Dizziness and giddiness: Secondary | ICD-10-CM | POA: Diagnosis not present

## 2020-05-22 DIAGNOSIS — R41841 Cognitive communication deficit: Secondary | ICD-10-CM

## 2020-05-23 ENCOUNTER — Encounter: Payer: Self-pay | Admitting: Speech Pathology

## 2020-05-23 NOTE — Therapy (Signed)
[place] MAIN [facility] SERVICES [address] [place], [place], [address] Phone: [phone]   Fax:  [phone]  Speech Language Pathology Treatment  Patient Details  Name: Carlos Stewart MRN: [id number] Date of Birth: [birth year] Referring Provider (SLP): Dr. Melrose Nakayama   Encounter Date: 05/22/2020   End of Session - 05/23/20 1215    Visit Number 19    Number of Visits 31    Date for SLP Re-Evaluation 06/28/20    Authorization Type BCBS    Authorization Time Period Start 03/20/2020    Authorization - Visit Number 9    Progress Note Due on Visit 10    SLP Start Time 1600    SLP Stop Time  1700    SLP Time Calculation (min) 60 min    Activity Tolerance Patient tolerated treatment well           History reviewed. No pertinent past medical history.  History reviewed. No pertinent surgical history.  There were no vitals filed for this visit.   Subjective Assessment - 05/23/20 1214    Subjective Focused, willing to participate                 ADULT SLP TREATMENT - 05/23/20 0001      General Information   Behavior/Cognition Alert;Cooperative;Pleasant mood    HPI Carlos Stewart is a 34 year old man with post-concussion syndrome with residual visual disturbance, memory loss, and word finding deficits.       Treatment Provided   Treatment provided Cognitive-Linquistic      Pain Assessment   Pain Assessment No/denies pain      Cognitive-Linquistic Treatment   Treatment focused on Cognition;Aphasia    Skilled Treatment VISION: Patient reports that he has seen a neuro- ophthalmologist and was told that his eyes are not tracking together. He is to get prism glasses that should make visual attention easier. WORD-FINDING: Completed 10 semantic feature analysis independently.  Patient completes each analysis with greater speed/efficiency and with more flexibility.      Assessment / Recommendations / Plan   Plan Continue with current plan  of care      Progression Toward Goals   Progression toward goals Progressing toward goals            SLP Education - 05/23/20 1214    Education Details Plan to work on Chief Operating Officer) Educated Patient    Methods Explanation    Comprehension Verbalized understanding              SLP Long Term Goals - 05/03/20 1409      SLP LONG TERM GOAL #1   Title The patient will complete word finding tasks with 80% accuracy and explain strategies to improve word finding.    Time 8    Period Weeks    Status Partially Met    Target Date 06/28/20      SLP LONG TERM GOAL #2   Title The patient will complete complex memory activities with 80% accuracy.    Time 8    Period Weeks    Status Partially Met    Target Date 06/28/20      SLP LONG TERM GOAL #3   Title The patient will demonstrate reading comprehension for complex written information using study strategies.    Time 8    Period Weeks    Status Partially Met    Target Date 06/28/20      SLP LONG TERM GOAL #4  Title The patient will improve written output speed and accuracy using word processing program.    Time 8    Period Weeks    Status Partially Met    Target Date 06/28/20            Plan - 05/23/20 1215    Clinical Impression Statement Patient continues to work hard during sessions and at home. Patient shows understanding of semantic feature analysis and completes each analysis with greater speed/efficiency and with more flexibility. Patient expressed interest in practicing information processing for greater speed and accuracy.    Speech Therapy Frequency 2x / week    Duration Other (comment)    Treatment/Interventions Language facilitation;Cognitive reorganization;Patient/family education    Potential to Achieve Goals Good    Potential Considerations Ability to learn/carryover information;Family/community support;Pain level;Previous level of function;Cooperation/participation level;Severity of  impairments;Medical prognosis    Consulted and Agree with Plan of Care Patient           Patient will benefit from skilled therapeutic intervention in order to improve the following deficits and impairments:   Cognitive communication deficit    Problem List There are no problems to display for this patient.  [name], MS/CCC- SLP  Lou Miner 05/23/2020, 12:16 PM  [place] MAIN [facility] SERVICES [address] [place], [place], [address] Phone: [phone]   Fax:  [phone]   Name: Carlos Stewart MRN: [id number] Date of Birth: 1986/09/30

## 2020-05-24 ENCOUNTER — Encounter: Payer: Self-pay | Admitting: Speech Pathology

## 2020-05-24 ENCOUNTER — Ambulatory Visit: Payer: BC Managed Care – PPO | Admitting: Speech Pathology

## 2020-05-24 ENCOUNTER — Other Ambulatory Visit: Payer: Self-pay

## 2020-05-24 DIAGNOSIS — R42 Dizziness and giddiness: Secondary | ICD-10-CM | POA: Diagnosis not present

## 2020-05-24 DIAGNOSIS — R41841 Cognitive communication deficit: Secondary | ICD-10-CM

## 2020-05-24 NOTE — Therapy (Signed)
[place] MAIN [facility] SERVICES [address] [place], [place], [address] Phone: [phone]   Fax:  [phone]  Speech Language Pathology Treatment/Progress Note  Speech Therapy Progress Note   Dates of reporting period 06/28/2020   to   05/24/2020   Patient Details  Name: Carlos Stewart MRN: [id number] Date of Birth: 04-19-1986 Referring Provider (SLP): Dr. Melrose Nakayama   Encounter Date: 05/24/2020   End of Session - 05/24/20 1704    Visit Number 20    Number of Visits 31    Date for SLP Re-Evaluation 06/28/20    Authorization Type BCBS    Authorization Time Period Start 03/20/2020    Authorization - Visit Number 10    Progress Note Due on Visit 10    SLP Start Time 1600    SLP Stop Time  1700    SLP Time Calculation (min) 60 min    Activity Tolerance Patient tolerated treatment well           History reviewed. No pertinent past medical history.  History reviewed. No pertinent surgical history.  There were no vitals filed for this visit.   Subjective Assessment - 05/24/20 1703    Subjective Focused, willing to participate                 ADULT SLP TREATMENT - 05/24/20 0001      General Information   Behavior/Cognition Alert;Cooperative;Pleasant mood    HPI Carlos Stewart is a 34 year old man with post-concussion syndrome with residual visual disturbance, memory loss, and word finding deficits.       Treatment Provided   Treatment provided Cognitive-Linquistic      Pain Assessment   Pain Assessment No/denies pain      Cognitive-Linquistic Treatment   Treatment focused on Cognition;Aphasia    Skilled Treatment VISION: Patient reports that he has seen a neuro- ophthalmologist and was told that his eyes are not tracking together. He has prism glasses that are making visual attention easier. EXECUTIVE SKILLS: Reviewed methods for solving grid logic problems.  Patient completed 4X4 and 4X5 grids independently.  He has website  to continue to use this for information processing practice.  Completed a database activity with 95% accuracy.  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      Assessment / Recommendations / Plan   Plan Continue with current plan of care      Progression Toward Goals   Progression toward goals Progressing toward goals            SLP Education - 05/24/20 1703    Education Details Strategies for simplifying information processing    Person(s) Educated Patient    Methods Explanation    Comprehension Verbalized understanding              SLP Long Term Goals - 05/24/20 1705      SLP LONG TERM GOAL #1   Title The patient will complete word finding tasks with 80% accuracy and explain strategies to improve word finding.    Status Achieved    Target Date 06/28/20      SLP LONG TERM GOAL #2   Title The patient will complete complex memory activities with 80% accuracy.    Period Weeks    Status Partially Met    Target Date 06/28/20      SLP LONG TERM GOAL #3   Title The patient will demonstrate reading comprehension for complex written information using study strategies.  Status Partially Met    Target Date 06/28/20      SLP LONG TERM GOAL #4   Title The patient will improve written output speed and accuracy using word processing program.    Status Partially Met    Target Date 06/28/20            Plan - 05/24/20 1704    Clinical Impression Statement Patient continues to work hard during sessions and at home. Patient shows understanding of semantic feature analysis and completes each analysis with greater speed/efficiency and with more flexibility. Patient expressed interest in practicing information processing for greater speed and accuracy.  He is completing higher information processing tasks with greater speed and accuracy.    Speech Therapy Frequency 2x / week    Duration Other (comment)    Treatment/Interventions Language  facilitation;Cognitive reorganization;Patient/family education    Potential to Achieve Goals Good    Potential Considerations Ability to learn/carryover information;Family/community support;Pain level;Previous level of function;Cooperation/participation level;Severity of impairments;Medical prognosis    Consulted and Agree with Plan of Care Patient           Patient will benefit from skilled therapeutic intervention in order to improve the following deficits and impairments:   Cognitive communication deficit    Problem List There are no problems to display for this patient.  [name], MS/CCC- SLP  Lou Miner 05/24/2020, 5:06 PM  [place] MAIN [facility] SERVICES [address] [place], [place], [address] Phone: [phone]   Fax:  [phone]   Name: Carlos Stewart MRN: [id number] Date of Birth: 1985/11/05

## 2020-05-29 ENCOUNTER — Ambulatory Visit: Payer: BC Managed Care – PPO | Admitting: Speech Pathology

## 2020-05-29 ENCOUNTER — Other Ambulatory Visit: Payer: Self-pay

## 2020-05-29 DIAGNOSIS — R42 Dizziness and giddiness: Secondary | ICD-10-CM | POA: Diagnosis not present

## 2020-05-29 DIAGNOSIS — R41841 Cognitive communication deficit: Secondary | ICD-10-CM

## 2020-05-30 ENCOUNTER — Encounter: Payer: Self-pay | Admitting: Speech Pathology

## 2020-05-30 ENCOUNTER — Ambulatory Visit: Payer: BC Managed Care – PPO

## 2020-05-30 ENCOUNTER — Other Ambulatory Visit: Payer: Self-pay

## 2020-05-30 ENCOUNTER — Ambulatory Visit: Payer: BC Managed Care – PPO | Admitting: Speech Pathology

## 2020-05-30 DIAGNOSIS — M542 Cervicalgia: Secondary | ICD-10-CM

## 2020-05-30 DIAGNOSIS — R41841 Cognitive communication deficit: Secondary | ICD-10-CM

## 2020-05-30 DIAGNOSIS — R42 Dizziness and giddiness: Secondary | ICD-10-CM | POA: Diagnosis not present

## 2020-05-30 NOTE — Therapy (Signed)
[place] MAIN [facility] SERVICES [address] [place], [place], [address] Phone: [phone]   Fax:  [phone]  Speech Language Pathology Treatment  Patient Details  Name: Carlos Stewart MRN: [id number] Date of Birth: [birth year] Referring Provider (SLP): Dr. Melrose Nakayama   Encounter Date: 05/30/2020   End of Session - 05/30/20 1428    Visit Number 22    Number of Visits 31    Date for SLP Re-Evaluation 06/28/20    Authorization Type BCBS    Authorization Time Period Start 05/29/2020    Authorization - Visit Number 2    Progress Note Due on Visit 10    SLP Start Time 0900    SLP Stop Time  0950    SLP Time Calculation (min) 50 min    Activity Tolerance Patient tolerated treatment well           History reviewed. No pertinent past medical history.  History reviewed. No pertinent surgical history.  There were no vitals filed for this visit.   Subjective Assessment - 05/30/20 1427    Subjective Patient noted that he "should be able to do this" during tx. Patient noted difficulty with outside stimulus present and was responsive to feedback throughout.                 ADULT SLP TREATMENT - 05/30/20 1426      General Information   Behavior/Cognition Alert;Cooperative;Pleasant mood    HPI Carlos Stewart is a 34 year old man with post-concussion syndrome with residual visual disturbance, memory loss, and word finding deficits.       Treatment Provided   Treatment provided Cognitive-Linquistic      Pain Assessment   Pain Assessment No/denies pain      Cognitive-Linquistic Treatment   Treatment focused on Cognition;Aphasia    Skilled Treatment TEXT COMPREHENSION: Patient completed two text comprehension worksheets based on written data provided with 90% accuracy. COMPLEX COMPREHENSION: Patient recalled information from a passage read by the clinician twice with 80% accuracy with minimal cueing from clinician. Patient's recall increased  in detail with self-written notes as support to encourage expression and organization.       Assessment / Recommendations / Plan   Plan Continue with current plan of care      Progression Toward Goals   Progression toward goals Progressing toward goals            SLP Education - 05/30/20 1428    Education Details Patient was educated on potential supports when reading or listening to a passage (paper, finger on page, self-written notes).    Person(s) Educated Patient    Methods Explanation    Comprehension Verbalized understanding              SLP Long Term Goals - 05/24/20 1705      SLP LONG TERM GOAL #1   Title The patient will complete word finding tasks with 80% accuracy and explain strategies to improve word finding.    Status Achieved    Target Date 06/28/20      SLP LONG TERM GOAL #2   Title The patient will complete complex memory activities with 80% accuracy.    Period Weeks    Status Partially Met    Target Date 06/28/20      SLP LONG TERM GOAL #3   Title The patient will demonstrate reading comprehension for complex written information using study strategies.    Status Partially Met    Target Date 06/28/20  SLP LONG TERM GOAL #4   Title The patient will improve written output speed and accuracy using word processing program.    Status Partially Met    Target Date 06/28/20            Plan - 05/30/20 1429    Clinical Impression Statement Based on the patient's level of education, it would be expected that completion of tasks targeting cognition and memory would be completed in less time and with less effort. The patient is aware of comprehension questions that are difficult for them and why. Patient expressed some difficulty in rereading his notes due to their organization.    Speech Therapy Frequency 2x / week    Duration Other (comment)    Treatment/Interventions Language facilitation;Cognitive reorganization;Patient/family education    Potential  to Achieve Goals Good    Potential Considerations Ability to learn/carryover information;Family/community support;Pain level;Previous level of function;Cooperation/participation level;Severity of impairments;Medical prognosis    Consulted and Agree with Plan of Care Patient           Patient will benefit from skilled therapeutic intervention in order to improve the following deficits and impairments:   Cognitive communication deficit    Problem List There are no problems to display for this patient.  [name], MS/CCC- SLP  Lou Miner 05/30/2020, 2:30 PM  [place] MAIN [facility] SERVICES [address] [place], [place], [address] Phone: [phone]   Fax:  [phone]   Name: Carlos Stewart MRN: [id number] Date of Birth: July 09, 1986

## 2020-05-30 NOTE — Therapy (Signed)
[place] MAIN [facility] SERVICES [address] [place], [place], [address] Phone: [phone]   Fax:  [phone]  Speech Language Pathology Treatment  Patient Details  Name: Carlos Stewart MRN: [id number] Date of Birth: 06-Jun-1986 Referring Provider (SLP): Dr. Melrose Nakayama   Encounter Date: 05/29/2020   End of Session - 05/30/20 1423    Visit Number 21    Number of Visits 31    Date for SLP Re-Evaluation 06/28/20    Authorization Type BCBS    Authorization Time Period Start 05/29/2020    Authorization - Visit Number 1    Progress Note Due on Visit 10    SLP Start Time 1500    SLP Stop Time  1550    SLP Time Calculation (min) 50 min    Activity Tolerance Patient tolerated treatment well           History reviewed. No pertinent past medical history.  History reviewed. No pertinent surgical history.  There were no vitals filed for this visit.   Subjective Assessment - 05/30/20 1422    Subjective Focused, willing to participate                 ADULT SLP TREATMENT - 05/30/20 0001      General Information   Behavior/Cognition Alert;Cooperative;Pleasant mood    HPI Carlos Stewart is a 34 year old man with post-concussion syndrome with residual visual disturbance, memory loss, and word finding deficits.       Treatment Provided   Treatment provided Cognitive-Linquistic      Pain Assessment   Pain Assessment No/denies pain      Cognitive-Linquistic Treatment   Treatment focused on Cognition;Aphasia    Skilled Treatment VISION: Patient reports that he has seen a neuro- ophthalmologist and was told that his eyes are not tracking together. He is adjusting to his prism glasses, making visual attention easier. EXECUTIVE SKILLS: As homework, patient completed Exercises from [employer] series (Word Grid, Tree surgeon, and Decoding).  The Word Grid exercise was difficult, he could not complete it.    He was 60% accurate completing a  Data Base exercise due to incomplete/inaccurate reading of question. He completed a Concept Classification exercise easily and demonstrated functional strategy for keeping track of his place.  VISUAL REASONING: Completed figural sequencing task, connect the dots (alphabetical), and figural grid given min-to-mod cues.  He easily completed a Personal assistant task.      Assessment / Recommendations / Plan   Plan Continue with current plan of care      Progression Toward Goals   Progression toward goals Progressing toward goals            SLP Education - 05/30/20 1423    Education Details Strategies for simplyfing information for processing    Person(s) Educated Patient    Methods Explanation    Comprehension Verbalized understanding              SLP Long Term Goals - 05/24/20 1705      SLP LONG TERM GOAL #1   Title The patient will complete word finding tasks with 80% accuracy and explain strategies to improve word finding.    Status Achieved    Target Date 06/28/20      SLP LONG TERM GOAL #2   Title The patient will complete complex memory activities with 80% accuracy.    Period Weeks    Status Partially Met    Target Date 06/28/20      SLP  LONG TERM GOAL #3   Title The patient will demonstrate reading comprehension for complex written information using study strategies.    Status Partially Met    Target Date 06/28/20      SLP LONG TERM GOAL #4   Title The patient will improve written output speed and accuracy using word processing program.    Status Partially Met    Target Date 06/28/20            Plan - 05/30/20 1424    Clinical Impression Statement Patient continues to work hard during sessions and at home. Patient shows understanding of semantic feature analysis and completes each analysis with greater speed/efficiency and with more flexibility. Patient expressed interest in practicing information processing for greater speed and accuracy.  He continues to have  difficulty with focused attention and information processing resulting in poorer accuracy and independence given his level of academic achievement.    Speech Therapy Frequency 2x / week    Duration Other (comment)    Treatment/Interventions Language facilitation;Cognitive reorganization;Patient/family education    Potential to Achieve Goals Good    Potential Considerations Ability to learn/carryover information;Family/community support;Pain level;Previous level of function;Cooperation/participation level;Severity of impairments;Medical prognosis    Consulted and Agree with Plan of Care Patient           Patient will benefit from skilled therapeutic intervention in order to improve the following deficits and impairments:   Cognitive communication deficit    Problem List There are no problems to display for this patient.  Carlos Sea, MS/CCC- SLP  Lou Miner 05/30/2020, 2:25 PM  [place] MAIN [facility] SERVICES [address] [place], [place], [address] Phone: [phone]   Fax:  [phone]   Name: Carlos Stewart MRN: [id number] Date of Birth: 1986-01-15
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
## 2020-06-05 ENCOUNTER — Encounter: Payer: BC Managed Care – PPO | Admitting: Speech Pathology

## 2020-06-06 ENCOUNTER — Other Ambulatory Visit: Payer: Self-pay

## 2020-06-06 ENCOUNTER — Ambulatory Visit: Payer: BC Managed Care – PPO | Admitting: Speech Pathology

## 2020-06-06 DIAGNOSIS — R41841 Cognitive communication deficit: Secondary | ICD-10-CM

## 2020-06-06 DIAGNOSIS — R42 Dizziness and giddiness: Secondary | ICD-10-CM | POA: Diagnosis not present

## 2020-06-07 ENCOUNTER — Ambulatory Visit: Payer: BC Managed Care – PPO | Admitting: Speech Pathology

## 2020-06-07 ENCOUNTER — Encounter: Payer: Self-pay | Admitting: Speech Pathology

## 2020-06-07 ENCOUNTER — Other Ambulatory Visit: Payer: Self-pay

## 2020-06-07 DIAGNOSIS — R41841 Cognitive communication deficit: Secondary | ICD-10-CM

## 2020-06-07 DIAGNOSIS — R42 Dizziness and giddiness: Secondary | ICD-10-CM | POA: Diagnosis not present

## 2020-06-07 NOTE — Therapy (Signed)
[place] MAIN [facility] SERVICES [address] [place], [place], [address] Phone: [phone]   Fax:  [phone]  Speech Language Pathology Treatment  Patient Details  Name: Carlos Stewart MRN: [id number] Date of Birth: 11-Jul-1986 Referring Provider (SLP): Dr. Melrose Nakayama   Encounter Date: 06/07/2020   End of Session - 06/07/20 1239    Visit Number 24    Number of Visits 31    Date for SLP Re-Evaluation 06/28/20    Authorization Type BCBS    Authorization Time Period Start 05/29/2020    Authorization - Visit Number 2    Authorization - Number of Visits 10    Progress Note Due on Visit 10    SLP Start Time 0900    SLP Stop Time  0950    SLP Time Calculation (min) 50 min    Activity Tolerance Patient tolerated treatment well           History reviewed. No pertinent past medical history.  History reviewed. No pertinent surgical history.  There were no vitals filed for this visit.   Subjective Assessment - 06/07/20 1238    Subjective Focused, willing to participate                 ADULT SLP TREATMENT - 06/07/20 1235      General Information   Behavior/Cognition Alert;Cooperative;Pleasant mood    HPI Carlos Stewart is a 34 year old man with post-concussion syndrome with residual visual disturbance, memory loss, and word finding deficits.       Treatment Provided   Treatment provided Cognitive-Linquistic      Pain Assessment   Pain Assessment No/denies pain      Cognitive-Linquistic Treatment   Treatment focused on Cognition;Aphasia    Skilled Treatment TEXT COMPREHENSION: Patient completed 10 text comprehension questions based on written passages with 100% accuracy independently.COMPLEX COMPREHENSION: Patient recalled information from one passage read by the clinician twice with 80% accuracy and some disorganization. Patient's written supports continue to aid in recall and expression. WORD FINDING: Patient completed a detailed  (2-3 responses for each) semantic feature analysis for 5 items, naming both general qualities and qualities specific to the image shown.        Assessment / Recommendations / Plan   Plan Continue with current plan of care      Progression Toward Goals   Progression toward goals Progressing toward goals            SLP Education - 06/07/20 1238    Education Details Information processing with written notes as support              SLP Long Term Goals - 05/24/20 1705      SLP LONG TERM GOAL #1   Title The patient will complete word finding tasks with 80% accuracy and explain strategies to improve word finding.    Status Achieved    Target Date 06/28/20      SLP LONG TERM GOAL #2   Title The patient will complete complex memory activities with 80% accuracy.    Period Weeks    Status Partially Met    Target Date 06/28/20      SLP LONG TERM GOAL #3   Title The patient will demonstrate reading comprehension for complex written information using study strategies.    Status Partially Met    Target Date 06/28/20      SLP LONG TERM GOAL #4   Title The patient will improve written output speed and  accuracy using word processing program.    Status Partially Met    Target Date 06/28/20            Plan - 06/07/20 1239    Clinical Impression Statement Patient noted that the second passage was more difficult to answer questions about as the organization of facts was more abstract. Improve data processing with passages and data sets. Continue to focus on describing details and improving speed within SFA and in passages. Target written notes and how they are organized to improve accurate and concise recall.    Speech Therapy Frequency 2x / week    Duration Other (comment)    Treatment/Interventions Language facilitation;Cognitive reorganization;Patient/family education    Potential to Achieve Goals Good    Potential Considerations Ability to learn/carryover  information;Family/community support;Pain level;Previous level of function;Cooperation/participation level;Severity of impairments;Medical prognosis    Consulted and Agree with Plan of Care Patient           Patient will benefit from skilled therapeutic intervention in order to improve the following deficits and impairments:   Cognitive communication deficit    Problem List There are no problems to display for this patient.  Claire Roberts, BS Graduate Clinician Claire Roberts 06/07/2020, 12:40 PM  [place] [facility] [address] [place], [place], [address] Phone: [phone]   Fax:  [phone]   Name: Carlos Stewart MRN: [id number] Date of Birth: 07/19/1986 

## 2020-06-07 NOTE — Therapy (Signed)
[place] MAIN [facility] SERVICES [address] [place], [place], [address] Phone: [phone]   Fax:  [phone]  Speech Language Pathology Treatment  Patient Details  Name: Carlos Stewart MRN: [id number] Date of Birth: September 11, 1986 Referring Provider (SLP): Dr. Melrose Nakayama   Encounter Date: 06/06/2020   End of Session - 06/07/20 0851    Visit Number 23    Number of Visits 31    Date for SLP Re-Evaluation 06/28/20    Authorization Type BCBS    Authorization Time Period Start 05/29/2020    Authorization - Visit Number 2    Authorization - Number of Visits 10    Progress Note Due on Visit 10    SLP Start Time 1400    SLP Stop Time  1450    SLP Time Calculation (min) 50 min    Activity Tolerance Patient tolerated treatment well           History reviewed. No pertinent past medical history.  History reviewed. No pertinent surgical history.  There were no vitals filed for this visit.   Subjective Assessment - 06/07/20 0851    Subjective Focused, willing to participate                 ADULT SLP TREATMENT - 06/07/20 0001      General Information   Behavior/Cognition Alert;Cooperative;Pleasant mood    HPI Carlos Stewart is a 34 year old man with post-concussion syndrome with residual visual disturbance, memory loss, and word finding deficits.       Treatment Provided   Treatment provided Cognitive-Linquistic      Pain Assessment   Pain Assessment No/denies pain      Cognitive-Linquistic Treatment   Treatment focused on Cognition;Aphasia    Skilled Treatment TEXT COMPREHENSION: Patient completed two text comprehension question worksheets based on written passages with 95% accuracy independently. COMPLEX COMPREHENSION: Patient recalled information from two passages read by the clinician twice with 90% accuracy. Patient's recall increased in detail with self written notes as support to encourage expression and organization. Able to recall  key information from two passages discussed at the beginning of the session at the end of the session accurately. VISUAL REASONING: Patient finished figural sequences independently with 100% accuracy.       Assessment / Recommendations / Plan   Plan Continue with current plan of care      Progression Toward Goals   Progression toward goals Progressing toward goals            SLP Education - 06/07/20 0851    Education Details Information processing with written notes as support              SLP Long Term Goals - 05/24/20 1705      SLP LONG TERM GOAL #1   Title The patient will complete word finding tasks with 80% accuracy and explain strategies to improve word finding.    Status Achieved    Target Date 06/28/20      SLP LONG TERM GOAL #2   Title The patient will complete complex memory activities with 80% accuracy.    Period Weeks    Status Partially Met    Target Date 06/28/20      SLP LONG TERM GOAL #3   Title The patient will demonstrate reading comprehension for complex written information using study strategies.    Status Partially Met    Target Date 06/28/20      SLP LONG TERM GOAL #4  Title The patient will improve written output speed and accuracy using word processing program.    Status Partially Met    Target Date 06/28/20            Plan - 06/07/20 0852    Clinical Impression Statement Continue to focus on organizing notes to promote accurate and concise recall. Organization has improved as patient utilizes a method to write down both general and detailed information. Patient wants to focus on memory recall of written and auditory information. Patient stated that visual sequences are rarely difficult for him to comprehend.    Speech Therapy Frequency 2x / week    Duration Other (comment)    Treatment/Interventions Language facilitation;Cognitive reorganization;Patient/family education    Potential to Achieve Goals Good    Potential Considerations  Ability to learn/carryover information;Family/community support;Pain level;Previous level of function;Cooperation/participation level;Severity of impairments;Medical prognosis    Consulted and Agree with Plan of Care Patient           Patient will benefit from skilled therapeutic intervention in order to improve the following deficits and impairments:   Cognitive communication deficit    Problem List There are no problems to display for this patient.  Lawernce Pitts, BS Graduate Clinician Lawernce Pitts 06/07/2020, 8:53 AM  [place] MAIN [facility] SERVICES [address] [place], [place], [address] Phone: [phone]   Fax:  [phone]   Name: Carlos Stewart MRN: [id number] Date of Birth: 06-21-1986

## 2020-06-12 ENCOUNTER — Ambulatory Visit: Payer: BC Managed Care – PPO | Admitting: Speech Pathology

## 2020-06-12 ENCOUNTER — Other Ambulatory Visit: Payer: Self-pay

## 2020-06-12 ENCOUNTER — Encounter: Payer: Self-pay | Admitting: Speech Pathology

## 2020-06-12 DIAGNOSIS — R41841 Cognitive communication deficit: Secondary | ICD-10-CM

## 2020-06-12 DIAGNOSIS — R42 Dizziness and giddiness: Secondary | ICD-10-CM | POA: Diagnosis not present

## 2020-06-12 NOTE — Therapy (Signed)
[place] MAIN [facility] SERVICES [address] [place], [place], [address] Phone: [phone]   Fax:  [phone]  Speech Language Pathology Treatment  Patient Details  Name: Carlos Stewart MRN: [id number] Date of Birth: 11-30-1985 Referring Provider (SLP): Dr. Melrose Nakayama   Encounter Date: 06/12/2020   End of Session - 06/12/20 1512    Visit Number 25    Number of Visits 31    Date for SLP Re-Evaluation 06/28/20    Authorization Type BCBS    Authorization Time Period Start 05/29/2020    Authorization - Visit Number 5    Authorization - Number of Visits 10    Progress Note Due on Visit 10    SLP Start Time 1400    SLP Stop Time  1450    SLP Time Calculation (min) 50 min    Activity Tolerance Patient tolerated treatment well           History reviewed. No pertinent past medical history.  History reviewed. No pertinent surgical history.  There were no vitals filed for this visit.   Subjective Assessment - 06/12/20 1511    Subjective Focused despite noise, willing to participate                 ADULT SLP TREATMENT - 06/12/20 0001      General Information   Behavior/Cognition Alert;Cooperative;Pleasant mood    HPI Carlos Stewart is a 34 year old man with post-concussion syndrome with residual visual disturbance, memory loss, and word finding deficits.       Treatment Provided   Treatment provided Cognitive-Linquistic      Pain Assessment   Pain Assessment No/denies pain      Cognitive-Linquistic Treatment   Treatment focused on Cognition;Aphasia    Skilled Treatment TEXT COMPREHENSION: Patient completed two Perplexors puzzles in 20 minutes independently and minimal errors throughout the solving process. COMPLEX COMPREHENSION: Patient recalled the main ideas information from two messages read twice by the clinician with 80% accuracy and some disorganization. Patient's written supports continue to aid in recall. WORD FINDING:  Patient described items using their sematic features with minimal cueing, to encourage greater detail, from clinician. Patient described 3 detailed pictures with minimal cues from the clinician for additional information.       Assessment / Recommendations / Plan   Plan Continue with current plan of care      Progression Toward Goals   Progression toward goals Progressing toward goals            SLP Education - 06/12/20 1511    Education Details Tools to support information processing              SLP Long Term Goals - 05/24/20 1705      SLP LONG TERM GOAL #1   Title The patient will complete word finding tasks with 80% accuracy and explain strategies to improve word finding.    Status Achieved    Target Date 06/28/20      SLP LONG TERM GOAL #2   Title The patient will complete complex memory activities with 80% accuracy.    Period Weeks    Status Partially Met    Target Date 06/28/20      SLP LONG TERM GOAL #3   Title The patient will demonstrate reading comprehension for complex written information using study strategies.    Status Partially Met    Target Date 06/28/20      SLP LONG TERM GOAL #4   Title  The patient will improve written output speed and accuracy using word processing program.    Status Partially Met    Target Date 06/28/20            Plan - 06/12/20 1513    Clinical Impression Statement Client is often accurate in information recall and expression overall, but expression is slow and appears labored due to difficulty organizing and executive functioning deficits a whole. Patient beginning to improve in taking notes in his own words to improve recall. Noise distraction encouraged the patient to focus; incorporate time restraints or distraction into tx.    Speech Therapy Frequency 2x / week    Duration Other (comment)    Treatment/Interventions Language facilitation;Cognitive reorganization;Patient/family education    Consulted and Agree with Plan  of Care Patient           Patient will benefit from skilled therapeutic intervention in order to improve the following deficits and impairments:   Cognitive communication deficit    Problem List There are no problems to display for this patient.  Carlos Stewart, BS Graduate Clinician Lou Miner 06/12/2020, 3:34 PM  [place] MAIN [facility] SERVICES [address] [place], [place], [address] Phone: [phone]   Fax:  [phone]   Name: Carlos Stewart MRN: [id number] Date of Birth: [birth year]

## 2020-06-13 ENCOUNTER — Other Ambulatory Visit: Payer: Self-pay

## 2020-06-13 ENCOUNTER — Ambulatory Visit: Payer: BC Managed Care – PPO | Attending: Neurology

## 2020-06-13 DIAGNOSIS — R42 Dizziness and giddiness: Secondary | ICD-10-CM | POA: Diagnosis not present

## 2020-06-13 DIAGNOSIS — R41841 Cognitive communication deficit: Secondary | ICD-10-CM | POA: Insufficient documentation
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
## 2020-06-14 ENCOUNTER — Ambulatory Visit: Payer: BC Managed Care – PPO | Admitting: Speech Pathology

## 2020-06-19 ENCOUNTER — Ambulatory Visit: Payer: BC Managed Care – PPO | Admitting: Speech Pathology

## 2020-06-19 ENCOUNTER — Other Ambulatory Visit: Payer: Self-pay

## 2020-06-19 DIAGNOSIS — R42 Dizziness and giddiness: Secondary | ICD-10-CM | POA: Diagnosis not present

## 2020-06-19 DIAGNOSIS — R41841 Cognitive communication deficit: Secondary | ICD-10-CM

## 2020-06-20 ENCOUNTER — Encounter: Payer: Self-pay | Admitting: Speech Pathology
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
## 2020-06-21 ENCOUNTER — Other Ambulatory Visit: Payer: Self-pay

## 2020-06-21 ENCOUNTER — Ambulatory Visit: Payer: BC Managed Care – PPO | Admitting: Speech Pathology

## 2020-06-21 ENCOUNTER — Encounter: Payer: Self-pay | Admitting: Speech Pathology

## 2020-06-21 DIAGNOSIS — R42 Dizziness and giddiness: Secondary | ICD-10-CM | POA: Diagnosis not present

## 2020-06-21 DIAGNOSIS — R41841 Cognitive communication deficit: Secondary | ICD-10-CM

## 2020-06-21 NOTE — Therapy (Signed)
[place] MAIN [facility] SERVICES [address] [place], [place], [address] Phone: [phone]   Fax:  [phone]  Speech Language Pathology Treatment  Patient Details  Name: EMILE KYLLO MRN: [id number] Date of Birth: [birth year] Referring Provider (SLP): Dr. Melrose Nakayama   Encounter Date: 06/21/2020   End of Session - 06/21/20 1706    Visit Number 27    Number of Visits 31    Date for SLP Re-Evaluation 06/28/20    Authorization Type BCBS    Authorization Time Period Start 05/29/2020    Authorization - Visit Number 7    Authorization - Number of Visits 10    Progress Note Due on Visit 10    SLP Start Time 1600    SLP Stop Time  1650    SLP Time Calculation (min) 50 min    Activity Tolerance Patient tolerated treatment well           History reviewed. No pertinent past medical history.  History reviewed. No pertinent surgical history.  There were no vitals filed for this visit.   Subjective Assessment - 06/21/20 1706    Subjective Patient notes some difficulty with organizing information                 ADULT SLP TREATMENT - 06/21/20 0001      General Information   Behavior/Cognition Alert;Cooperative;Pleasant mood    HPI Ephrem Carrick is a 34 year old man with post-concussion syndrome with residual visual disturbance, memory loss, and word finding deficits.       Treatment Provided   Treatment provided Cognitive-Linquistic      Cognitive-Linquistic Treatment   Treatment focused on Cognition;Aphasia    Skilled Treatment TEXT COMPREHENSION: Patient completed a Perplexor puzzle of increased complexity with 100% acc, independently. Patient answered questions given data within a schedule format with 80% acc independently, and 100% acc after rereading missed questions. COMPLEX COMPREHENSION: Recalled the main gist of two short paragraphs after reading and taking notes, some difficulty with sentence order and lack of conciseness. WORD  FINDING: Patient completed 3 Semantic Feature Analysis Charts verbally and independently after recalling instructions for a familiar task (procedural).       Assessment / Recommendations / Plan   Plan Continue with current plan of care      Progression Toward Goals   Progression toward goals Progressing toward goals            SLP Education - 06/21/20 1706    Education Details reminded that written supports can always be used if needed    Person(s) Educated Patient    Methods Explanation    Comprehension Verbalized understanding              SLP Long Term Goals - 05/24/20 1705      SLP LONG TERM GOAL #1   Title The patient will complete word finding tasks with 80% accuracy and explain strategies to improve word finding.    Status Achieved    Target Date 06/28/20      SLP LONG TERM GOAL #2   Title The patient will complete complex memory activities with 80% accuracy.    Period Weeks    Status Partially Met    Target Date 06/28/20      SLP LONG TERM GOAL #3   Title The patient will demonstrate reading comprehension for complex written information using study strategies.    Status Partially Met    Target Date 06/28/20  SLP LONG TERM GOAL #4   Title The patient will improve written output speed and accuracy using word processing program.    Status Partially Met    Target Date 06/28/20            Plan - 06/21/20 1707    Clinical Impression Statement Patient noted that he attempted to provide specific information in a planned phone call without a list as written support, and forgot what he wished to share. Incorporate more memory tasks and continue to focus on written supports as an option in the future. The patient continues to improve in processing information and re-telling in his own words. Target conciseness and introduce time restraints.    Speech Therapy Frequency 2x / week    Duration Other (comment)    Treatment/Interventions Language  facilitation;Cognitive reorganization;Patient/family education    Potential to Achieve Goals Good    Potential Considerations Ability to learn/carryover information;Family/community support;Pain level;Previous level of function;Cooperation/participation level;Severity of impairments;Medical prognosis    Consulted and Agree with Plan of Care Patient           Patient will benefit from skilled therapeutic intervention in order to improve the following deficits and impairments:   Cognitive communication deficit    Problem List There are no problems to display for this patient.  Lawernce Pitts, BS Graduate Clinician Lawernce Pitts 06/21/2020, 5:07 PM  [place] MA[facility] SERVICES [address] [place], [place], [address] Phone: [phone]   Fax:  [phone]   Name: MALVIN MORRISH MRN: [id number] Date of Birth: Oct 05, 1986

## 2020-06-26 ENCOUNTER — Other Ambulatory Visit: Payer: Self-pay

## 2020-06-26 ENCOUNTER — Encounter: Payer: Self-pay | Admitting: Speech Pathology

## 2020-06-26 ENCOUNTER — Ambulatory Visit: Payer: BC Managed Care – PPO | Admitting: Speech Pathology

## 2020-06-26 DIAGNOSIS — R42 Dizziness and giddiness: Secondary | ICD-10-CM | POA: Diagnosis not present

## 2020-06-26 DIAGNOSIS — R41841 Cognitive communication deficit: Secondary | ICD-10-CM

## 2020-06-26 NOTE — Therapy (Signed)
[place] MAIN [facility] SERVICES [address] [place], [place], [address] Phone: [phone]   Fax:  [phone]  Speech Language Pathology Treatment  Patient Details  Name: Carlos Stewart MRN: [id number] Date of Birth: 1986/03/12 Referring Provider (SLP): Dr. Melrose Nakayama   Encounter Date: 06/26/2020   End of Session - 06/26/20 1718    Visit Number 28    Number of Visits 31    Date for SLP Re-Evaluation 06/28/20    Authorization Type BCBS    Authorization Time Period Start 05/29/2020    Authorization - Visit Number 8    Authorization - Number of Visits 10    Progress Note Due on Visit 10    SLP Start Time 9485    SLP Stop Time  1655    SLP Time Calculation (min) 50 min           History reviewed. No pertinent past medical history.  History reviewed. No pertinent surgical history.  There were no vitals filed for this visit.   Subjective Assessment - 06/26/20 1717    Subjective Patient notes some difficulty with organizing information    Currently in Pain? No/denies                 ADULT SLP TREATMENT - 06/26/20 0001      General Information   Behavior/Cognition Alert;Cooperative;Pleasant mood    HPI Carlos Stewart is a 34 year old man with post-concussion syndrome with residual visual disturbance, memory loss, and word finding deficits.       Treatment Provided   Treatment provided Cognitive-Linquistic      Cognitive-Linquistic Treatment   Treatment focused on Cognition;Aphasia    Skilled Treatment TEXT COMPREHENSION: Patient answered text comprehension questions given written text with 75% acc independently. One answer was incomplete and another was reads incorrectly by the patient. Patient began a math worksheet that involved symbols, and noted no difficulty with computations, but made errors throughout due to abundance of symbols. WORD FINDING: Patient recalled procedural information (sea kayaking) in cogent, concise sentences  with adequate storytelling skills. Patient completed 2 Semantic Feature Analysis Charts silently and independently and then compiled information verbally.       Assessment / Recommendations / Plan   Plan Continue with current plan of care      Progression Toward Goals   Progression toward goals Progressing toward goals            SLP Education - 06/26/20 1717    Education Details Utilizing organization techniques, written notes    Person(s) Educated Patient    Methods Explanation    Comprehension Verbalized understanding              SLP Long Term Goals - 05/24/20 1705      SLP LONG TERM GOAL #1   Title The patient will complete word finding tasks with 80% accuracy and explain strategies to improve word finding.    Status Achieved    Target Date 06/28/20      SLP LONG TERM GOAL #2   Title The patient will complete complex memory activities with 80% accuracy.    Period Weeks    Status Partially Met    Target Date 06/28/20      SLP LONG TERM GOAL #3   Title The patient will demonstrate reading comprehension for complex written information using study strategies.    Status Partially Met    Target Date 06/28/20      SLP LONG TERM GOAL #4  Title The patient will improve written output speed and accuracy using word processing program.    Status Partially Met    Target Date 06/28/20            Plan - 06/26/20 1718    Clinical Impression Statement Continue to target problem solving when given options, organization, and connecting known and novel information. Patient notes that a strength is connecting some information and word finding is not as difficult as before. Although word finding is not as difficult as it was previously, patient still experiences moments of labored verbal recall and explanations.    Speech Therapy Frequency 2x / week    Duration Other (comment)    Treatment/Interventions Language facilitation;Cognitive reorganization;Patient/family education     Potential to Achieve Goals Good    Potential Considerations Ability to learn/carryover information;Family/community support;Pain level;Previous level of function;Cooperation/participation level;Severity of impairments;Medical prognosis    Consulted and Agree with Plan of Care Patient           Patient will benefit from skilled therapeutic intervention in order to improve the following deficits and impairments:   Cognitive communication deficit    Problem List There are no problems to display for this patient.  Lawernce Pitts, BS Graduate Clinician Lawernce Pitts 06/26/2020, 5:19 PM  [place] MAIN [facility] SERVICES [address] [place], [place], [address] Phone: [phone]   Fax:  [phone]   Name: Carlos Stewart MRN: [id number] Date of Birth: 1986-03-22

## 2020-06-27 ENCOUNTER — Ambulatory Visit: Payer: BC Managed Care – PPO | Admitting: Speech Pathology

## 2020-06-27 ENCOUNTER — Other Ambulatory Visit: Payer: Self-pay

## 2020-06-27 ENCOUNTER — Ambulatory Visit: Payer: BC Managed Care – PPO

## 2020-06-27 DIAGNOSIS — R42 Dizziness and giddiness: Secondary | ICD-10-CM

## 2020-06-27 DIAGNOSIS — R41841 Cognitive communication deficit: Secondary | ICD-10-CM
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
## 2020-06-28 ENCOUNTER — Encounter: Payer: Self-pay | Admitting: Speech Pathology

## 2020-06-28 NOTE — Therapy (Signed)
[place] MAIN [facility] SERVICES [address] [place], [place], [address] Phone: [phone]   Fax:  [phone]  Speech Language Pathology Treatment  Patient Details  Name: Carlos Stewart MRN: [id number] Date of Birth: 10/16/1985 Referring Provider (SLP): Dr. Melrose Nakayama   Encounter Date: 06/27/2020   End of Session - 06/28/20 0821    Visit Number 29    Number of Visits 31    Date for SLP Re-Evaluation 06/28/20    Authorization Type BCBS    Authorization Time Period Start 05/29/2020    Authorization - Visit Number 9    Authorization - Number of Visits 10    Progress Note Due on Visit 10    SLP Start Time 1502    SLP Stop Time  1552    SLP Time Calculation (min) 50 min    Activity Tolerance Patient tolerated treatment well           History reviewed. No pertinent past medical history.  History reviewed. No pertinent surgical history.  There were no vitals filed for this visit.   Subjective Assessment - 06/28/20 0820    Subjective Organization difficulty                 ADULT SLP TREATMENT - 06/28/20 0001      General Information   Behavior/Cognition Alert;Cooperative;Pleasant mood    HPI Cuinn Westerhold is a 34 year old man with post-concussion syndrome with residual visual disturbance, memory loss, and word finding deficits.       Treatment Provided   Treatment provided Cognitive-Linquistic      Cognitive-Linquistic Treatment   Treatment focused on Cognition;Aphasia    Skilled Treatment TEXT COMPREHENSION: Patient answered text comprehension questions given written text (prescription label) with 90% acc independently. Patient organized sentences to form a cohesive paragraph and supported his decisions with strategies. WORD FINDING: Patient recalled procedural information (cranial nerves) in cogent, concise sentences with adequate storytelling skills and a mnemonic previously learned. Patient completed 3 Semantic Feature Analysis  Charts silently and independently utilizing hand written supports, and then compiled information verbally.  EXECUTIVE SKILLS: Patient given problem Advice worker, thought through current issue using this structure and stated it would be helpful in the future.       Assessment / Recommendations / Plan   Plan Continue with current plan of care      Progression Toward Goals   Progression toward goals Progressing toward goals            SLP Education - 06/28/20 0821    Education Details Problem solving strategies    Person(s) Educated Patient    Methods Explanation    Comprehension Verbalized understanding              SLP Long Term Goals - 05/24/20 1705      SLP LONG TERM GOAL #1   Title The patient will complete word finding tasks with 80% accuracy and explain strategies to improve word finding.    Status Achieved    Target Date 06/28/20      SLP LONG TERM GOAL #2   Title The patient will complete complex memory activities with 80% accuracy.    Period Weeks    Status Partially Met    Target Date 06/28/20      SLP LONG TERM GOAL #3   Title The patient will demonstrate reading comprehension for complex written information using study strategies.    Status Partially Met    Target Date 06/28/20  SLP LONG TERM GOAL #4   Title The patient will improve written output speed and accuracy using word processing program.    Status Partially Met    Target Date 06/28/20            Plan - 06/28/20 0821    Clinical Impression Statement Target problem solving using examples from patient's life and other examples with the organizer. Word finding continues to improve, but recall can appear labored. Continue to target organization of recall and forming cogent paragraphs/ utterances using provided text. Continue to encourage written/ other strategies to support the patient. Incorporate reading comprehension and retelling.    Speech Therapy Frequency 2x / week    Duration Other  (comment)    Treatment/Interventions Language facilitation;Cognitive reorganization;Patient/family education    Potential to Achieve Goals Good    Potential Considerations Ability to learn/carryover information;Family/community support;Pain level;Previous level of function;Cooperation/participation level;Severity of impairments;Medical prognosis    Consulted and Agree with Plan of Care Patient           Patient will benefit from skilled therapeutic intervention in order to improve the following deficits and impairments:   Cognitive communication deficit    Problem List There are no problems to display for this patient.  Lawernce Pitts, BS Graduate Clinician Lawernce Pitts 06/28/2020, 8:22 AM  [place] [facility] SERVICES [address] [place], [place], [address] Phone: [phone]   Fax:  [phone]   Name: Carlos Stewart MRN: [id number] Date of Birth: 01/21/1986

## 2020-07-04 ENCOUNTER — Ambulatory Visit: Payer: BC Managed Care – PPO | Admitting: Speech Pathology

## 2020-07-04 ENCOUNTER — Other Ambulatory Visit: Payer: Self-pay

## 2020-07-04 DIAGNOSIS — R41841 Cognitive communication deficit: Secondary | ICD-10-CM

## 2020-07-04 DIAGNOSIS — R42 Dizziness and giddiness: Secondary | ICD-10-CM | POA: Diagnosis not present

## 2020-07-05 ENCOUNTER — Encounter: Payer: Self-pay | Admitting: Speech Pathology

## 2020-07-05 ENCOUNTER — Other Ambulatory Visit: Payer: Self-pay

## 2020-07-05 ENCOUNTER — Ambulatory Visit: Payer: BC Managed Care – PPO | Admitting: Speech Pathology

## 2020-07-05 DIAGNOSIS — R42 Dizziness and giddiness: Secondary | ICD-10-CM | POA: Diagnosis not present

## 2020-07-05 DIAGNOSIS — R41841 Cognitive communication deficit: Secondary | ICD-10-CM

## 2020-07-05 NOTE — Progress Notes (Signed)
07/06/2020 9:38 AM   Carlos Stewart [birth year] [id number]  Referring provider: Morene Crocker, MD [phone] [facility] [address] [facility] [place],  [place] [address] No chief complaint on file.   HPI: Carlos Stewart is a 34 y.o. male who is seen today for evaluation and management of drug-induced erectile dysfunction.   -The patient has been follow by Dr. Malvin Johns for post concussion syndrome, neck pain, double vision, mood disorder, aphasia and short term memory loss.  -He was last seen by Dr. Malvin Johns on 03/07/20. Double vision was less frequent.  -On multiple medications which have affected his erections.  Complains of partial erections which are firm enough for penetration but has difficulty maintaining and delayed ejaculation  -Depakote was increased to 250 mg BID, Seroquel was increased to 100 mg daily.  -He continued on clonazepam 0.5 mg TID and nortriptyline 50 mg daily.  -He reports sensation incomplete bladder emptying that started around the same time he was placed on several medications.  -He will see a specialist at [facility] next month. He is hoping to stop majority of his medications.     PMH: History reviewed. No pertinent past medical history.  Surgical History: History reviewed. No pertinent surgical history.  Home Medications:  Allergies as of 07/06/2020      Reactions   Ceclor [cefaclor] Other (See Comments)   Pt had reaction as a child   Penicillins Hives, Itching      Medication List       Accurate as of July 06, 2020  9:38 AM. If you have any questions, ask your nurse or doctor.        Adderall XR 20 MG 24 hr capsule Generic drug: amphetamine-dextroamphetamine Take 20 mg by mouth every morning.   albuterol 108 (90 Base) MCG/ACT inhaler Commonly known as: VENTOLIN HFA INHALE 2 PUFFS BY MOUTH EVERY 4 TO 6 HOURS AS NEEDED FOR COUGH FOR SHORTNESS OF BREATH OR CHEST TIGHTNESS   clonazePAM 0.5 MG tablet Commonly known as: KLONOPIN Take  0.5 mg by mouth 3 (three) times daily.   nortriptyline 50 MG capsule Commonly known as: PAMELOR Take 50 mg by mouth at bedtime.   rosuvastatin 40 MG tablet Commonly known as: CRESTOR Take 40 mg by mouth daily.       Allergies:  Allergies  Allergen Reactions   Ceclor [Cefaclor] Other (See Comments)    Pt had reaction as a child   Penicillins Hives and Itching    Family History: History reviewed. No pertinent family history.  Social History:  reports that he has never smoked. He has never used smokeless tobacco. He reports current alcohol use. No history on file for drug use.   Physical Exam: BP 130/80    Pulse 74    Ht 6\' 1"  (1.854 m)    Wt 215 lb (97.5 kg)    BMI 28.37 kg/m   Constitutional:  Alert and oriented, No acute distress. HEENT: [place] AT, moist mucus membranes.  Trachea midline, no masses. Cardiovascular: No clubbing, cyanosis, or edema. Respiratory: Normal respiratory effort, no increased work of breathing. GI: Abdomen is soft, nontender, nondistended, no abdominal masses GU: No CVA tenderness.  No bladder fullness or masses.  Patient with circumcised phallus. Urethral meatus is patent. No penile discharge. No penile lesions or rashes. Scrotum without lesions, cysts, rashes and/or edema. Testicles are located scrotally bilaterally. No masses are appreciated in the testicles. Left and right epididymis are normal. Lymph: No cervical or inguinal lymphadenopathy. Skin: No  rashes, bruises or suspicious lesions. Neurologic: Grossly intact, no focal deficits, moving all 4 extremities. Psychiatric: Normal mood and affect..   Assessment & Plan:    1.  Erectile dysfunction  Most likely secondary to medication effect  PDE 5 inhibitor trial and Rx generic tadalafil 20 mg sent to pharmacy  Dosing and side effects were discussed  2.  Lower urinary tract symptoms  Most likely secondary to nortriptyline  Symptoms not bothersome and he does not desire  treatment   [facility] [address] [place], [place] [address] [phone]  I, Theador Hawthorne, am acting as a scribe for Dr. Lorin Picket C. Roey Coopman,  I have reviewed the above documentation for accuracy and completeness, and I agree with the above.   Riki Altes, MD
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
## 2020-07-05 NOTE — Therapy (Signed)
[facility] MAIN [facility] SERVICES [address] [place], [place], [address] Phone: [phone]   Fax:  [phone]  Speech Language Pathology Treatment  Patient Details  Name: Carlos Stewart MRN: [id number] Date of Birth: 27-May-1986 Referring Provider (SLP): Dr. Malvin Johns   Encounter Date: 07/05/2020   End of Session - 07/05/20 1732    Visit Number 31    Number of Visits 46    Date for SLP Re-Evaluation 08/23/20    Authorization Type BCBS    Authorization Time Period Start 05/29/2020    Authorization - Visit Number 1    Authorization - Number of Visits 10    Progress Note Due Stewart Visit 10    SLP Start Time 1500    SLP Stop Time  1550    SLP Time Calculation (min) 50 min    Activity Tolerance Patient tolerated treatment well           History reviewed. No pertinent past medical history.  History reviewed. No pertinent surgical history.  There were no vitals filed for this visit.   Subjective Assessment - 07/05/20 1731    Subjective engaged throughout session                 ADULT SLP TREATMENT - 07/05/20 1730      General Information   Behavior/Cognition Alert;Cooperative    HPI Carlos Stewart is a 34 year old man with post-concussion syndrome with residual visual disturbance, memory loss, and word finding deficits.       Treatment Provided   Treatment provided Cognitive-Linquistic      Cognitive-Linquistic Treatment   Treatment focused Stewart Cognition;Aphasia    Skilled Treatment TEXT COMPREHENSION: Patient completed one Perplexor (Level C) with moderate difficulty and required mod cues from clinician to complete the puzzle Patient recognized that organization and similarities of names utilized in the puzzle likely lead to this difficulty.EXECUTIVE SKILLS: Patient utilized problem Nurse, adult to outline the steps needed to remind himself to begin tasks he has been putting off. Both patient and clinician listed strategies as  well as advantages to completing the tasks. Patient notes that completing tasks is not as difficult as beginning them. Patient ordered sentences (9, 11) into cogent paragraphs based Stewart provided information, inferencing skills, and judgement. Patient recognizes road blocks when completing this activity and overcomes them.       Assessment / Recommendations / Plan   Plan Continue with current plan of care      Progression Toward Goals   Progression toward goals Progressing toward goals            SLP Education - 07/05/20 1732    Education Details problem solving strategies, incorporating into tx    Person(s) Educated Patient    Methods Explanation    Comprehension Verbalized understanding              SLP Long Term Goals - 07/05/20 1116      SLP LONG TERM GOAL #1   Title The patient will complete word finding tasks with 80% accuracy and explain strategies to improve word finding.    Status Achieved      SLP LONG TERM GOAL #2   Title The patient will complete complex memory activities with 80% accuracy.    Status Achieved      SLP LONG TERM GOAL #3   Title The patient will demonstrate reading comprehension for complex written information using study strategies.    Status Achieved  SLP LONG TERM GOAL #4   Title The patient will improve written output speed and accuracy using word processing program.    Status Achieved      SLP LONG TERM GOAL #5   Title Patient will identify cognitive-communication barriers and participate in developing functional compensatory strategies.    Time 8    Period Weeks    Status New    Target Date 08/23/20      Additional Long Term Goals   Additional Long Term Goals Yes      SLP LONG TERM GOAL #6   Title Patient will complete complex executive function skills tasks with 80% accuracy.    Time 8    Period Weeks    Status New    Target Date 08/23/20            Plan - 07/05/20 1733    Clinical Impression Statement Continue to  incorporate new goals, focusing Stewart problem solving and executive skills. Patient will provide relevant problems impeded by deficits and will utilize strategies/ framework to solve/ alleviate the issue. Continue to encourage writing strategies, for practice and memory, and self organization. Continue to target executive function skills with auditory and text comprehension, incorporating note taking when needed to promote organization and accurate recall.    Speech Therapy Frequency 2x / week    Duration Other (comment)    Treatment/Interventions Language facilitation;Cognitive reorganization;Patient/family education    Potential to Achieve Goals Good    Potential Considerations Ability to learn/carryover information;Family/community support;Pain level;Previous level of function;Cooperation/participation level;Severity of impairments;Medical prognosis    Consulted and Agree with Plan of Care Patient           Patient will benefit from skilled therapeutic intervention in order to improve the following deficits and impairments:   Cognitive communication deficit    Problem List There are no problems to display for this patient.  [name], BS Graduate Clinician [name] 07/05/2020, 5:33 PM  [facility] MAIN [facility] SERVICES [address] [place], [place], [address] Phone: [phone]   Fax:  [phone]   Name: Carlos Stewart MRN: [id number] Date of Birth: 02/19/1986

## 2020-07-06 ENCOUNTER — Encounter: Payer: Self-pay | Admitting: Urology

## 2020-07-06 ENCOUNTER — Ambulatory Visit (INDEPENDENT_AMBULATORY_CARE_PROVIDER_SITE_OTHER): Payer: BC Managed Care – PPO | Admitting: Urology

## 2020-07-06 ENCOUNTER — Other Ambulatory Visit: Payer: Self-pay

## 2020-07-06 VITALS — BP 130/80 | HR 74 | Ht 73.0 in | Wt 215.0 lb

## 2020-07-06 DIAGNOSIS — R399 Unspecified symptoms and signs involving the genitourinary system: Secondary | ICD-10-CM | POA: Diagnosis not present

## 2020-07-06 DIAGNOSIS — N528 Other male erectile dysfunction: Secondary | ICD-10-CM | POA: Diagnosis not present

## 2020-07-06 MED ORDER — TADALAFIL 20 MG PO TABS: ORAL_TABLET | ORAL | 0 refills | Status: DC

## 2020-07-09 ENCOUNTER — Encounter: Payer: Self-pay | Admitting: Speech Pathology

## 2020-07-09 ENCOUNTER — Other Ambulatory Visit: Payer: Self-pay

## 2020-07-09 ENCOUNTER — Ambulatory Visit: Payer: BC Managed Care – PPO

## 2020-07-09 ENCOUNTER — Ambulatory Visit: Payer: BC Managed Care – PPO | Admitting: Speech Pathology

## 2020-07-09 DIAGNOSIS — R41841 Cognitive communication deficit: Secondary | ICD-10-CM

## 2020-07-09 DIAGNOSIS — R42 Dizziness and giddiness: Secondary | ICD-10-CM | POA: Diagnosis not present

## 2020-07-09 NOTE — Therapy (Signed)
[facility] MAIN [facility] SERVICES [address] [place], [place], [address] Phone: [phone]   Fax:  [phone]  Speech Language Pathology Treatment  Patient Details  Name: Carlos Stewart MRN: [id number] Date of Birth: 03-02-1986 Referring Provider (SLP): Dr. Malvin Johns   Encounter Date: 07/09/2020   End of Session - 07/09/20 1719    Visit Number 32    Number of Visits 46    Date for SLP Re-Evaluation 08/23/20    Authorization Type BCBS    Authorization Time Period Start 07/05/2020    Authorization - Visit Number 2    Authorization - Number of Visits 10    Progress Note Due on Visit 10    SLP Start Time 1500    SLP Stop Time  1550    SLP Time Calculation (min) 50 min    Activity Tolerance Patient tolerated treatment well           History reviewed. No pertinent past medical history.  History reviewed. No pertinent surgical history.  There were no vitals filed for this visit.   Subjective Assessment - 07/09/20 1719    Subjective Engaged throughout session, working towards independence                   SLP Education - 07/09/20 1719    Education Details Problem solving strategies, initiating tasks    Person(s) Educated Patient    Methods Explanation    Comprehension Verbalized understanding              SLP Long Term Goals - 07/05/20 1116      SLP LONG TERM GOAL #1   Title The patient will complete word finding tasks with 80% accuracy and explain strategies to improve word finding.    Status Achieved      SLP LONG TERM GOAL #2   Title The patient will complete complex memory activities with 80% accuracy.    Status Achieved      SLP LONG TERM GOAL #3   Title The patient will demonstrate reading comprehension for complex written information using study strategies.    Status Achieved      SLP LONG TERM GOAL #4   Title The patient will improve written output speed and accuracy using word processing program.     Status Achieved      SLP LONG TERM GOAL #5   Title Patient will identify cognitive-communication barriers and participate in developing functional compensatory strategies.    Time 8    Period Weeks    Status New    Target Date 08/23/20      Additional Long Term Goals   Additional Long Term Goals Yes      SLP LONG TERM GOAL #6   Title Patient will complete complex executive function skills tasks with 80% accuracy.    Time 8    Period Weeks    Status New    Target Date 08/23/20            Plan - 07/09/20 1720    Clinical Impression Statement Continue to focus on cognitive/ communication breakdowns the patient experiences throughout the week in problem solving target. Patient will provide relevant problems impeded by deficits and will utilize strategies/ framework to solve/ alleviate the issue. Encourage the patient to self assess after activities, explaining reasoning and problem solving as needed. Continue to encourage writing. Target executive function skills with auditory and text comprehension, challenging the patient with logic and word problems.  Speech Therapy Frequency 2x / week    Duration Other (comment)    Treatment/Interventions Language facilitation;Cognitive reorganization;Patient/family education    Potential to Achieve Goals Good    Potential Considerations Ability to learn/carryover information;Family/community support;Pain level;Previous level of function;Cooperation/participation level;Severity of impairments;Medical prognosis    Consulted and Agree with Plan of Care Patient           Patient will benefit from skilled therapeutic intervention in order to improve the following deficits and impairments:   Cognitive communication deficit    Problem List There are no problems to display for this patient.  [name], BS Graduate Clinician Leandrew Koyanagi 07/10/2020, 10:57 AM  [place] MAIN [facility] SERVICES [address] [place], [place], [address] Phone: [phone]   Fax:  [phone]   Name: Carlos Stewart MRN: [id number] Date of Birth: August 28, 1986
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
## 2020-07-10 ENCOUNTER — Ambulatory Visit: Payer: BC Managed Care – PPO | Admitting: Speech Pathology

## 2020-07-10 ENCOUNTER — Encounter: Payer: Self-pay | Admitting: Speech Pathology

## 2020-07-10 DIAGNOSIS — R41841 Cognitive communication deficit: Secondary | ICD-10-CM

## 2020-07-10 DIAGNOSIS — R42 Dizziness and giddiness: Secondary | ICD-10-CM | POA: Diagnosis not present

## 2020-07-10 NOTE — Therapy (Signed)
[place] [facility] MAIN [facility] SERVICES [address] [place], [place], [address] Phone: [phone]   Fax:  [phone]  Speech Language Pathology Treatment  Patient Details  Name: Carlos Stewart MRN: [id number] Date of Birth: 16-Feb-1986 Referring Provider (SLP): Dr. Malvin Johns   Encounter Date: 07/10/2020   End of Session - 07/10/20 1522    Visit Number 33    Number of Visits 46    Date for SLP Re-Evaluation 08/23/20    Authorization Type BCBS    Authorization Time Period Start 07/05/2020    Authorization - Visit Number 3    Authorization - Number of Visits 10    Progress Note Due on Visit 10    SLP Start Time 1305    SLP Stop Time  1355    SLP Time Calculation (min) 50 min    Activity Tolerance Patient tolerated treatment well           History reviewed. No pertinent past medical history.  History reviewed. No pertinent surgical history.  There were no vitals filed for this visit.   Subjective Assessment - 07/10/20 1521    Subjective Engaged throughout session                 ADULT SLP TREATMENT - 07/10/20 0001      General Information   Behavior/Cognition Alert;Cooperative    HPI Carlos Stewart is a 34 year old man with post-concussion syndrome with residual visual disturbance, memory loss, and word finding deficits.       Treatment Provided   Treatment provided Cognitive-Linquistic      Cognitive-Linquistic Treatment   Treatment focused on Cognition;Aphasia    Skilled Treatment COMPREHENSION: Patient completed one (Level C) Perplexor independently with use of strategies to promote accuracy in completing the puzzle. Patient noted that the task was challenging, but used his methods to solve. Patient recalled the pain idea of a long paragraph in his own words following clinician reading, improving at putting information into his own words. 100% acc answering reading comprehension and inferencing questions from provided text.  EXECUTIVE FUNCTION: Patient noted that problem solving/ memory strategies have been helping him to accomplish what is on his list at home. Patient generated a list of positives and negatives of a given situation, and creatively brainstormed problem solving methods for each negative. He recalled a related story to one situation with no lapse in cogency.       Assessment / Recommendations / Plan   Plan Continue with current plan of care      Progression Toward Goals   Progression toward goals Progressing toward goals            SLP Education - 07/10/20 1521    Education Details Problem solving strategies, organization    Person(s) Educated Patient    Methods Explanation    Comprehension Verbalized understanding              SLP Long Term Goals - 07/05/20 1116      SLP LONG TERM GOAL #1   Title The patient will complete word finding tasks with 80% accuracy and explain strategies to improve word finding.    Status Achieved      SLP LONG TERM GOAL #2   Title The patient will complete complex memory activities with 80% accuracy.    Status Achieved      SLP LONG TERM GOAL #3   Title The patient will demonstrate reading comprehension for complex written information using study strategies.  Status Achieved      SLP LONG TERM GOAL #4   Title The patient will improve written output speed and accuracy using word processing program.    Status Achieved      SLP LONG TERM GOAL #5   Title Patient will identify cognitive-communication barriers and participate in developing functional compensatory strategies.    Time 8    Period Weeks    Status New    Target Date 08/23/20      Additional Long Term Goals   Additional Long Term Goals Yes      SLP LONG TERM GOAL #6   Title Patient will complete complex executive function skills tasks with 80% accuracy.    Time 8    Period Weeks    Status New    Target Date 08/23/20            Plan - 07/10/20 1522    Clinical Impression  Statement Continue to focus on cognitive/ communication breakdowns the patient experiences throughout the week in problem solving target. Continue to encourage self assessment and accuracy after taks, problem solving as needed. Encourage writing as a strategy for recall and organization. Target executive function skills with auditory and text comprehension, challenging the patient with logic and word problems.    Speech Therapy Frequency 2x / week    Duration Other (comment)    Treatment/Interventions Language facilitation;Cognitive reorganization;Patient/family education    Potential to Achieve Goals Good    Potential Considerations Ability to learn/carryover information;Family/community support;Pain level;Previous level of function;Cooperation/participation level;Severity of impairments;Medical prognosis    Consulted and Agree with Plan of Care Patient           Patient will benefit from skilled therapeutic intervention in order to improve the following deficits and impairments:   Cognitive communication deficit    Problem List There are no problems to display for this patient.  [name], BS Graduate Clinician [name] 07/10/2020, 3:23 PM  [place] [facility] MAIN [facility] SERVICES [address] [place], [place], [address] Phone: [phone]   Fax:  [phone]   Name: Carlos Stewart MRN: [id number] Date of Birth: 04-13-1986

## 2020-07-12 ENCOUNTER — Encounter: Payer: BC Managed Care – PPO | Admitting: Speech Pathology

## 2020-07-18 ENCOUNTER — Encounter: Payer: Self-pay | Admitting: Speech Pathology

## 2020-07-18 ENCOUNTER — Ambulatory Visit: Payer: BC Managed Care – PPO | Attending: Neurology | Admitting: Speech Pathology

## 2020-07-18 ENCOUNTER — Other Ambulatory Visit: Payer: Self-pay

## 2020-07-18 DIAGNOSIS — R42 Dizziness and giddiness: Secondary | ICD-10-CM | POA: Diagnosis present

## 2020-07-18 DIAGNOSIS — R41841 Cognitive communication deficit: Secondary | ICD-10-CM | POA: Diagnosis present

## 2020-07-19 ENCOUNTER — Ambulatory Visit: Payer: BC Managed Care – PPO | Admitting: Speech Pathology

## 2020-07-19 ENCOUNTER — Encounter: Payer: Self-pay | Admitting: Speech Pathology

## 2020-07-19 ENCOUNTER — Other Ambulatory Visit: Payer: Self-pay

## 2020-07-19 DIAGNOSIS — R41841 Cognitive communication deficit: Secondary | ICD-10-CM | POA: Diagnosis not present

## 2020-07-19 NOTE — Therapy (Signed)
[name] MAIN [facility] SERVICES [address] [place], [place], [address] Phone: [phone]   Fax:  [phone]  Speech Language Pathology Treatment  Patient Details  Name: Carlos Stewart MRN: [id number] Date of Birth: 1986-04-01 Referring Provider (SLP): Dr. Malvin Johns   Encounter Date: 07/19/2020   End of Session - 07/19/20 1544    Visit Number 35    Number of Visits 46    Date for SLP Re-Evaluation 08/23/20    Authorization Type BCBS    Authorization Time Period Start 07/05/2020    Authorization - Visit Number 3    Authorization - Number of Visits 10    Progress Note Due on Visit 10    SLP Start Time 1505           History reviewed. No pertinent past medical history.  History reviewed. No pertinent surgical history.  There were no vitals filed for this visit.   Subjective Assessment - 07/19/20 1542    Subjective Pt pleasant and cooperative during therapy. Decreased eye contact noted.    Currently in Pain? No/denies                 ADULT SLP TREATMENT - 07/19/20 1543      General Information   Behavior/Cognition Alert;Cooperative;Pleasant mood    HPI Carlos Stewart is a 34 year old man with post-concussion syndrome with residual visual disturbance, memory loss, and word finding deficits.       Treatment Provided   Treatment provided Cognitive-Linquistic      Cognitive-Linquistic Treatment   Treatment focused on Cognition    Skilled Treatment Pt was seen for skilled ST intervention targeting goals for increased cognitive linguistic function. SLP facilitated session by presenting an article aloud for pt to take notes, as if in class. Pt then answered questions on the article. He then wrote a paragraph response to a question about the article. Pt was encouraged to listen to you tube video presentations on the topic of his choice; take notes as if in class. During the session, pt utilizes organization strategies including use of 2  different color ink pens, and dividing notes into columns.      Assessment / Recommendations / Plan   Plan Continue with current plan of care      Progression Toward Goals   Progression toward goals Progressing toward goals            SLP Education - 07/19/20 1544    Education Details Pt was encouraged to listen to podcasts and take notes for preparation of returning to class    Person(s) Educated Patient    Methods Explanation;Demonstration    Comprehension Verbalized understanding              SLP Long Term Goals - 07/05/20 1116      SLP LONG TERM GOAL #1   Title The patient will complete word finding tasks with 80% accuracy and explain strategies to improve word finding.    Status Achieved      SLP LONG TERM GOAL #2   Title The patient will complete complex memory activities with 80% accuracy.    Status Achieved      SLP LONG TERM GOAL #3   Title The patient will demonstrate reading comprehension for complex written information using study strategies.    Status Achieved      SLP LONG TERM GOAL #4   Title The patient will improve written output speed and accuracy using word processing program.  Status Achieved      SLP LONG TERM GOAL #5   Title Patient will identify cognitive-communication barriers and participate in developing functional compensatory strategies.    Time 8    Period Weeks    Status New    Target Date 08/23/20      Additional Long Term Goals   Additional Long Term Goals Yes      SLP LONG TERM GOAL #6   Title Patient will complete complex executive function skills tasks with 80% accuracy.    Time 8    Period Weeks    Status New    Target Date 08/23/20            Plan - 07/19/20 1545    Clinical Impression Statement Pt is motivated to improve and return to PT school when ok with MD.    Speech Therapy Frequency 2x / week    Duration Other (comment)    Treatment/Interventions Language facilitation;Cognitive  reorganization;Patient/family education    Potential Considerations Ability to learn/carryover information;Family/community support;Pain level;Previous level of function;Cooperation/participation level;Severity of impairments;Medical prognosis    Consulted and Agree with Plan of Care Patient           Patient will benefit from skilled therapeutic intervention in order to improve the following deficits and impairments:   Cognitive communication deficit    Problem List There are no problems to display for this patient.  Carlos Stewart, [facility], CCC-SLP Speech Language Pathologist  Carlos Stewart 07/19/2020, 3:55 PM  [facility] MAIN [facility] SERVICES [address] [place], [place], [address] Phone: [phone]   Fax:  [phone]   Name: Carlos Stewart MRN: [id number] Date of Birth: [birth year]

## 2020-07-19 NOTE — Therapy (Signed)
[facility] MAIN [facility] SERVICES [address] [place], [place], [address] Phone: [phone]   Fax:  [phone]  Speech Language Pathology Treatment  Patient Details  Name: Carlos Stewart MRN: [id number] Date of Birth: 09/10/1986 Referring Provider (SLP): Dr. Malvin Johns   Encounter Date: 07/18/2020   End of Session - 07/19/20 1228    Visit Number 34    Number of Visits 46    Date for SLP Re-Evaluation 08/23/20    Authorization Type BCBS    Authorization Time Period Start 07/05/2020    Authorization - Visit Number 3    Progress Note Due on Visit 10    SLP Start Time 1305    SLP Stop Time  1355    SLP Time Calculation (min) 50 min    Activity Tolerance Patient tolerated treatment well           History reviewed. No pertinent past medical history.  History reviewed. No pertinent surgical history.  There were no vitals filed for this visit.   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 No/denies                 ADULT SLP TREATMENT - 07/19/20 0001      General Information   Behavior/Cognition Alert;Cooperative;Pleasant mood    HPI Carlos Stewart is a 34 year old man with post-concussion syndrome with residual visual disturbance, memory loss, and word finding deficits.       Treatment Provided   Treatment provided Cognitive-Linquistic      Cognitive-Linquistic Treatment   Treatment focused on Cognition    Skilled Treatment Pt was seen for skilled ST intervention targeting goals for increased cognitive linguistic function. Pt completed deduction puzzle #6, and 3/3 multiprocess reasoning problems (SCATBI) correctly with without cues. He indicated a desire to work on "taking notes" as he would if in class. This will be addressed next session.       Assessment / Recommendations / Plan   Plan Continue with current plan of care      Progression Toward Goals   Progression toward  goals Progressing toward goals            SLP Education - 07/19/20 1228    Education Details compensatory strategies    Person(s) Educated Patient    Methods Explanation    Comprehension Verbalized understanding              SLP Long Term Goals - 07/05/20 1116      SLP LONG TERM GOAL #1   Title The patient will complete word finding tasks with 80% accuracy and explain strategies to improve word finding.    Status Achieved      SLP LONG TERM GOAL #2   Title The patient will complete complex memory activities with 80% accuracy.    Status Achieved      SLP LONG TERM GOAL #3   Title The patient will demonstrate reading comprehension for complex written information using study strategies.    Status Achieved      SLP LONG TERM GOAL #4   Title The patient will improve written output speed and accuracy using word processing program.    Status Achieved      SLP LONG TERM GOAL #5   Title Patient will identify cognitive-communication barriers and participate in developing functional compensatory strategies.    Time 8    Period Weeks    Status  New    Target Date 08/23/20      Additional Long Term Goals   Additional Long Term Goals Yes      SLP LONG TERM GOAL #6   Title Patient will complete complex executive function skills tasks with 80% accuracy.    Time 8    Period Weeks    Status New    Target Date 08/23/20            Plan - 07/19/20 0844    Clinical Impression Statement Pt is capable of performing high level multiprocess reasoning tasks, but reports his communication skils and higher level cognition are not where they were or need to be. Continued ST intervention is recommended per plan of care.    Speech Therapy Frequency 2x / week    Duration Other (comment)    Treatment/Interventions Language facilitation;Cognitive reorganization;Patient/family education    Potential to Achieve Goals Good    Potential Considerations Ability to learn/carryover  information;Family/community support;Pain level;Previous level of function;Cooperation/participation level;Severity of impairments;Medical prognosis    Consulted and Agree with Plan of Care Patient           Patient will benefit from skilled therapeutic intervention in order to improve the following deficits and impairments:   Cognitive communication deficit    Problem List There are no problems to display for this patient.  Carolena Fairbank B. Murvin Natal, [facility], CCC-SLP Speech Language Pathologist  Leigh Aurora 07/19/2020, 12:30 PM  [place] [facility] MAIN [facility] SERVICES [address] [place], [place], [address] Phone: [phone]   Fax:  [phone]   Name: Carlos Stewart MRN: [id number] Date of Birth: 12-Feb-1986

## 2020-07-23 ENCOUNTER — Encounter: Payer: Self-pay | Admitting: Speech Pathology

## 2020-07-23 ENCOUNTER — Ambulatory Visit: Payer: BC Managed Care – PPO | Admitting: Speech Pathology

## 2020-07-23 ENCOUNTER — Other Ambulatory Visit: Payer: Self-pay

## 2020-07-23 ENCOUNTER — Other Ambulatory Visit: Payer: Self-pay | Admitting: Urology

## 2020-07-23 DIAGNOSIS — R41841 Cognitive communication deficit: Secondary | ICD-10-CM

## 2020-07-23 NOTE — Therapy (Signed)
[place] [facility] MAIN [facility] SERVICES [address] [place], [place], [address] Phone: [phone]   Fax:  [phone]  Speech Language Pathology Treatment  Patient Details  Name: Carlos Stewart MRN: [id number] Date of Birth: Jul 03, 1986 Referring Provider (SLP): Dr. Malvin Johns   Encounter Date: 07/23/2020   End of Session - 07/23/20 1610    Visit Number 36    Number of Visits 46    Date for SLP Re-Evaluation 08/23/20    Authorization Type BCBS    Authorization Time Period Start 07/05/2020    Authorization - Visit Number 4    Authorization - Number of Visits 10    Progress Note Due on Visit 10    SLP Start Time 1501    SLP Stop Time  1551    SLP Time Calculation (min) 50 min    Activity Tolerance Patient tolerated treatment well           History reviewed. No pertinent past medical history.  History reviewed. No pertinent surgical history.  There were no vitals filed for this visit.   Subjective Assessment - 07/23/20 1609    Subjective pleasant and cooperative, shared updates from week                 ADULT SLP TREATMENT - 07/23/20 0001      General Information   Behavior/Cognition Alert;Cooperative;Pleasant mood    HPI Carlos Stewart is a 34 year old man with post-concussion syndrome with residual visual disturbance, memory loss, and word finding deficits.       Treatment Provided   Treatment provided Cognitive-Linquistic      Cognitive-Linquistic Treatment   Treatment focused on Cognition    Skilled Treatment COMPREHENSION: Pt completed one Level C Perplexor independently, noting problem solving when unsure. SLP presented article aloud, pt took notes (dividing and leaving room on paper) to provide main ideas with 90% acc. Answered 4 questions with 25% acc. EXECUTIVE SKILLS: Pt shared how past week has been in terms of communication, noting difficulty organizing thoughts, following the conversation. Esp with doctors, pt notes  difficulty staying on track or jumping to other topics prematurely. SLP and pt problem solves and discussed strategies to be implemented when information will be shared. PT notes success with being flexible in past sessions, sharing necessary info. Introduced space time retrieval as an option to increase recall over time.       Assessment / Recommendations / Plan   Plan Continue with current plan of care      Progression Toward Goals   Progression toward goals Progressing toward goals            SLP Education - 07/23/20 1609    Education Details note taking, strategies to have when needed    Person(s) Educated Patient    Methods Explanation    Comprehension Verbalized understanding              SLP Long Term Goals - 07/05/20 1116      SLP LONG TERM GOAL #1   Title The patient will complete word finding tasks with 80% accuracy and explain strategies to improve word finding.    Status Achieved      SLP LONG TERM GOAL #2   Title The patient will complete complex memory activities with 80% accuracy.    Status Achieved      SLP LONG TERM GOAL #3   Title The patient will demonstrate reading comprehension for complex written information using study strategies.  Status Achieved      SLP LONG TERM GOAL #4   Title The patient will improve written output speed and accuracy using word processing program.    Status Achieved      SLP LONG TERM GOAL #5   Title Patient will identify cognitive-communication barriers and participate in developing functional compensatory strategies.    Time 8    Period Weeks    Status New    Target Date 08/23/20      Additional Long Term Goals   Additional Long Term Goals Yes      SLP LONG TERM GOAL #6   Title Patient will complete complex executive function skills tasks with 80% accuracy.    Time 8    Period Weeks    Status New    Target Date 08/23/20            Plan - 07/23/20 1610    Clinical Impression Statement Patient remains  motivated to return to school, likely after the New Year. Balance of being flexible in conversation and having strategies available if needed. Continue to discuss strengths/ deficits related to cognitive communication. Focus specifically on note taking, critical thinking, and following a conversation. Continue to incorporate spaced time retrieval with information from past sessions.    Speech Therapy Frequency 2x / week    Duration Other (comment)    Treatment/Interventions Language facilitation;Cognitive reorganization;Patient/family education    Potential to Achieve Goals Good    Potential Considerations Ability to learn/carryover information;Family/community support;Pain level;Previous level of function;Cooperation/participation level;Severity of impairments;Medical prognosis    Consulted and Agree with Plan of Care Patient           Patient will benefit from skilled therapeutic intervention in order to improve the following deficits and impairments:   Cognitive communication deficit    Problem List There are no problems to display for this patient.  [name], BS Graduate Clinician [name] 07/23/2020, 4:11 PM  [place] [facility] MAIN [facility] SERVICES [address] [place], [place], [address] Phone: [phone]   Fax:  [phone]   Name: ABDUL BEIRNE MRN: [id number] Date of Birth: 06/05/1986

## 2020-07-24 ENCOUNTER — Ambulatory Visit: Payer: BC Managed Care – PPO

## 2020-07-24 DIAGNOSIS — R42 Dizziness and giddiness: Secondary | ICD-10-CM

## 2020-07-24 DIAGNOSIS — R41841 Cognitive communication deficit: Secondary | ICD-10-CM | POA: Diagnosis not present
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
## 2020-07-25 ENCOUNTER — Ambulatory Visit: Payer: BC Managed Care – PPO | Admitting: Speech Pathology

## 2020-07-25 ENCOUNTER — Other Ambulatory Visit: Payer: Self-pay

## 2020-07-25 DIAGNOSIS — R41841 Cognitive communication deficit: Secondary | ICD-10-CM

## 2020-07-26 ENCOUNTER — Encounter: Payer: Self-pay | Admitting: Speech Pathology

## 2020-07-26 NOTE — Therapy (Signed)
[place] [facility] MAIN [facility] SERVICES [address] [place], [place], [address] Phone: [phone]   Fax:  [phone]  Speech Language Pathology Treatment  Patient Details  Name: Carlos Stewart MRN: [id number] Date of Birth: [birth year] Referring Provider (SLP): Dr. Malvin Johns   Encounter Date: 07/25/2020   End of Session - 07/26/20 1533    Visit Number 37    Number of Visits 46    Date for SLP Re-Evaluation 08/23/20    Authorization Type BCBS    Authorization Time Period Start 07/05/2020    Authorization - Visit Number 5    Progress Note Due on Visit 10    SLP Start Time 1500    SLP Stop Time  1550    SLP Time Calculation (min) 50 min    Activity Tolerance Patient tolerated treatment well           History reviewed. No pertinent past medical history.  History reviewed. No pertinent surgical history.  There were no vitals filed for this visit.   Subjective Assessment - 07/26/20 1528    Subjective pleasant and cooperative                 ADULT SLP TREATMENT - 07/26/20 0001      General Information   Behavior/Cognition Alert;Cooperative;Pleasant mood    HPI Carlos Stewart is a 34 year old man with post-concussion syndrome with residual visual disturbance, memory loss, and word finding deficits.       Treatment Provided   Treatment provided Cognitive-Linquistic      Pain Assessment   Pain Assessment No/denies pain      Cognitive-Linquistic Treatment   Treatment focused on Cognition    Skilled Treatment MEMORY:  Recall/repeat 10-word list given 3 repetitions of list and cue questions.  Accurately recall 10/10 words at 5 minutes, 15 minutes, and 25 minutes.  INFORMATION PROCESSING:  Answer 100 True/False statements presented orally at a rapid pace with 96% accuracy.  Complete Perplexor puzzle (Level C) independently.      Assessment / Recommendations / Plan   Plan Continue with current plan of care      Progression Toward Goals    Progression toward goals Progressing toward goals            SLP Education - 07/26/20 1532    Education Details use strategies to maximize accuracy and independence    Person(s) Educated Patient    Methods Explanation    Comprehension Verbalized understanding              SLP Long Term Goals - 07/05/20 1116      SLP LONG TERM GOAL #1   Title The patient will complete word finding tasks with 80% accuracy and explain strategies to improve word finding.    Status Achieved      SLP LONG TERM GOAL #2   Title The patient will complete complex memory activities with 80% accuracy.    Status Achieved      SLP LONG TERM GOAL #3   Title The patient will demonstrate reading comprehension for complex written information using study strategies.    Status Achieved      SLP LONG TERM GOAL #4   Title The patient will improve written output speed and accuracy using word processing program.    Status Achieved      SLP LONG TERM GOAL #5   Title Patient will identify cognitive-communication barriers and participate in developing functional compensatory strategies.    Time 8  Period Weeks    Status New    Target Date 08/23/20      Additional Long Term Goals   Additional Long Term Goals Yes      SLP LONG TERM GOAL #6   Title Patient will complete complex executive function skills tasks with 80% accuracy.    Time 8    Period Weeks    Status New    Target Date 08/23/20            Plan - 07/26/20 1533    Clinical Impression Statement Patient remains motivated to return to school, likely after the New Year. Balance of being flexible in conversation and having strategies available if needed. Continue to discuss strengths/ deficits related to cognitive communication. Focus specifically on note taking, critical thinking, and following a conversation. Continue to incorporate spaced time retrieval with information from past sessions.    Speech Therapy Frequency 2x / week    Duration 8  weeks    Treatment/Interventions Language facilitation;Cognitive reorganization;Patient/family education    Potential to Achieve Goals Good    Potential Considerations Ability to learn/carryover information;Family/community support;Pain level;Previous level of function;Cooperation/participation level;Severity of impairments;Medical prognosis    Consulted and Agree with Plan of Care Patient           Patient will benefit from skilled therapeutic intervention in order to improve the following deficits and impairments:   Cognitive communication deficit    Problem List There are no problems to display for this patient.  Dollene Primrose, MS/CCC- SLP  Carlos Stewart 07/26/2020, 3:34 PM  [place] [facility] MAIN [facility] SERVICES [address] [place], [place], [address] Phone: [phone]   Fax:  [phone]   Name: Carlos Stewart MRN: [id number] Date of Birth: Sep 10, 1986

## 2020-07-30 ENCOUNTER — Encounter: Payer: BC Managed Care – PPO | Admitting: Speech Pathology

## 2020-07-30 ENCOUNTER — Encounter: Payer: Self-pay | Admitting: Speech Pathology

## 2020-07-30 ENCOUNTER — Ambulatory Visit: Payer: BC Managed Care – PPO | Admitting: Speech Pathology

## 2020-07-30 ENCOUNTER — Other Ambulatory Visit: Payer: Self-pay

## 2020-07-30 DIAGNOSIS — R41841 Cognitive communication deficit: Secondary | ICD-10-CM

## 2020-07-30 NOTE — Therapy (Signed)
[place] [facility] MAIN [facility] SERVICES [address] [place], [place], [address] Phone: [phone]   Fax:  [phone]  Speech Language Pathology Treatment  Patient Details  Name: Carlos Stewart MRN: [id number] Date of Birth: 1986/05/08 Referring Provider (SLP): Dr. Malvin Johns   Encounter Date: 07/30/2020   End of Session - 07/30/20 1721    Visit Number 38    Number of Visits 46    Date for SLP Re-Evaluation 08/23/20    Authorization Type BCBS    Authorization Time Period Start 07/05/2020    Authorization - Visit Number 6    Authorization - Number of Visits 10    Progress Note Due on Visit 10    SLP Start Time 1500    SLP Stop Time  1550    SLP Time Calculation (min) 50 min    Activity Tolerance Patient tolerated treatment well           History reviewed. No pertinent past medical history.  History reviewed. No pertinent surgical history.  There were no vitals filed for this visit.   Subjective Assessment - 07/30/20 1720    Subjective cooperative, engaged in treatment and progress    Currently in Pain? No/denies                 ADULT SLP TREATMENT - 07/30/20 0001      General Information   Behavior/Cognition Alert;Cooperative;Pleasant mood    HPI Carlos Stewart is a 34 year old man with post-concussion syndrome with residual visual disturbance, memory loss, and word finding deficits.       Treatment Provided   Treatment provided Cognitive-Linquistic      Cognitive-Linquistic Treatment   Treatment focused on Cognition    Skilled Treatment MEMORY: Recall/ repeat 10 word list from last session at beginning of session with 70% acc with no reminders, 100% independently immediately after. After 5, 10, 15 minutes pt recalled 10/10 words. Began to encode separate 10 list, will revisit. INFORMATION PROCESSING: Pt completed Perplexor puzzle (Level C) with 70% acc independently, 100% given min-mod cues in the form of pointing out incorrect  answers and encouraging to reread clues. Pt answered 100 True/ False statements presented orally at a rapid pace with 95% acc.       Assessment / Recommendations / Plan   Plan Continue with current plan of care      Progression Toward Goals   Progression toward goals Progressing toward goals            SLP Education - 07/30/20 1721    Education Details strategies, encouraged to begin study routine    Person(s) Educated Patient    Methods Explanation    Comprehension Verbalized understanding              SLP Long Term Goals - 07/05/20 1116      SLP LONG TERM GOAL #1   Title The patient will complete word finding tasks with 80% accuracy and explain strategies to improve word finding.    Status Achieved      SLP LONG TERM GOAL #2   Title The patient will complete complex memory activities with 80% accuracy.    Status Achieved      SLP LONG TERM GOAL #3   Title The patient will demonstrate reading comprehension for complex written information using study strategies.    Status Achieved      SLP LONG TERM GOAL #4   Title The patient will improve written output speed and accuracy  using word processing program.    Status Achieved      SLP LONG TERM GOAL #5   Title Patient will identify cognitive-communication barriers and participate in developing functional compensatory strategies.    Time 8    Period Weeks    Status New    Target Date 08/23/20      Additional Long Term Goals   Additional Long Term Goals Yes      SLP LONG TERM GOAL #6   Title Patient will complete complex executive function skills tasks with 80% accuracy.    Time 8    Period Weeks    Status New    Target Date 08/23/20            Plan - 07/30/20 1721    Clinical Impression Statement Pt remains motivated to return to school soon, encouraged to review material or begin study habits for practice. Pt recovered well during session when interrupted by alarm for spaced time retrieval to recall list.  Noted that recall and other tasks were challenging, incorporate rapid cognition tasks. Continue to discuss strengths/ deficits related to communication and memory. Continue to target note taking if desired. Incorporate spaced time retrieval for word lists and other information.    Speech Therapy Frequency 2x / week    Duration 8 weeks    Treatment/Interventions Language facilitation;Cognitive reorganization;Patient/family education    Potential to Achieve Goals Good    Potential Considerations Ability to learn/carryover information;Family/community support;Pain level;Previous level of function;Cooperation/participation level;Severity of impairments;Medical prognosis           Patient will benefit from skilled therapeutic intervention in order to improve the following deficits and impairments:   Cognitive communication deficit    Problem List There are no problems to display for this patient.  [name], BS Graduate Clinician [name] 07/30/2020, 5:22 PM  [place] [facility] MAIN [facility] SERVICES [address] [place], [place], [address] Phone: [phone]   Fax:  [phone]   Name: Carlos Stewart MRN: [id number] Date of Birth: Feb 07, 1986

## 2020-08-01 ENCOUNTER — Ambulatory Visit: Payer: BC Managed Care – PPO | Admitting: Speech Pathology

## 2020-08-01 ENCOUNTER — Other Ambulatory Visit: Payer: Self-pay

## 2020-08-01 DIAGNOSIS — R41841 Cognitive communication deficit: Secondary | ICD-10-CM | POA: Diagnosis not present

## 2020-08-02 ENCOUNTER — Encounter: Payer: Self-pay | Admitting: Speech Pathology

## 2020-08-02 NOTE — Therapy (Signed)
[facility] MAIN [facility] SERVICES [address] [place], [place], [address] Phone: [phone]   Fax:  [phone]  Speech Language Pathology Treatment  Patient Details  Name: Carlos Stewart MRN: [id number] Date of Birth: [birth year] Referring Provider (SLP): Dr. Malvin Johns   Encounter Date: 08/01/2020   End of Session - 08/02/20 0813    Visit Number 39    Number of Visits 46    Date for SLP Re-Evaluation 08/23/20    Authorization Type BCBS    Authorization Time Period Start 07/05/2020    Authorization - Visit Number 7    Authorization - Number of Visits 10    Progress Note Due on Visit 10    SLP Start Time 1500    SLP Stop Time  1550    SLP Time Calculation (min) 50 min    Activity Tolerance Patient tolerated treatment well           History reviewed. No pertinent past medical history.  History reviewed. No pertinent surgical history.  There were no vitals filed for this visit.   Subjective Assessment - 08/02/20 0812    Subjective cooperative, increase involvement in goals/ progress                 ADULT SLP TREATMENT - 08/02/20 0001      General Information   Behavior/Cognition Alert;Cooperative;Pleasant mood    HPI Carlos Stewart is a 34 year old man with post-concussion syndrome with residual visual disturbance, memory loss, and word finding deficits.       Treatment Provided   Treatment provided Cognitive-Linquistic      Cognitive-Linquistic Treatment   Treatment focused on Cognition    Skilled Treatment MEMORY: Recall ongoing 10 word list from last session with 100% acc, recall 5, 10, 15 minutes 20 words after encoding second 10 word list with 100% acc. Reminder of strategies to support memory and cognitive organization. INFORMATION PROCESSING: Pt completed Perplexor Puzzle (Level C) independently with 100% acc, clues were more straightforward. Pt answered 100 T/F statements with 98% acc. Auditory "buffer" can be a  hinderance. Provided "whole" given the "part", observed difficulty hearing or differentiating between minimal pairs. Took notes on a passage read aloud by clinician, recalled main ideas and answered questions with 100% acc.       Assessment / Recommendations / Plan   Plan Continue with current plan of care      Progression Toward Goals   Progression toward goals Progressing toward goals            SLP Education - 08/02/20 0813    Education Details strategies, supports for return to school    Person(s) Educated Patient    Methods Explanation    Comprehension Verbalized understanding              SLP Long Term Goals - 07/05/20 1116      SLP LONG TERM GOAL #1   Title The patient will complete word finding tasks with 80% accuracy and explain strategies to improve word finding.    Status Achieved      SLP LONG TERM GOAL #2   Title The patient will complete complex memory activities with 80% accuracy.    Status Achieved      SLP LONG TERM GOAL #3   Title The patient will demonstrate reading comprehension for complex written information using study strategies.    Status Achieved      SLP LONG TERM GOAL #4   Title The patient  will improve written output speed and accuracy using word processing program.    Status Achieved      SLP LONG TERM GOAL #5   Title Patient will identify cognitive-communication barriers and participate in developing functional compensatory strategies.    Time 8    Period Weeks    Status New    Target Date 08/23/20      Additional Long Term Goals   Additional Long Term Goals Yes      SLP LONG TERM GOAL #6   Title Patient will complete complex executive function skills tasks with 80% accuracy.    Time 8    Period Weeks    Status New    Target Date 08/23/20            Plan - 08/02/20 0813    Clinical Impression Statement Continue to target spaced time retrieval for memory, incorporating specialized words. Remind pt that strategies should be  utilized for deficits as often as possible, goal is to adapt to "new normal". Follow up with results from hearing screening, discussion about other professionals for support during his transition back to school in the coming months. Increase focus on auditory deficits. Increase complexity of information processing materials.    Speech Therapy Frequency 2x / week    Duration 8 weeks    Treatment/Interventions Language facilitation;Cognitive reorganization;Patient/family education    Potential to Achieve Goals Good    Potential Considerations Ability to learn/carryover information;Family/community support;Pain level;Previous level of function;Cooperation/participation level;Severity of impairments;Medical prognosis    Consulted and Agree with Plan of Care Patient           Patient will benefit from skilled therapeutic intervention in order to improve the following deficits and impairments:   Cognitive communication deficit    Problem List There are no problems to display for this patient.  Carlos Stewart, BS Graduate Clinician Carlos Stewart 08/02/2020, 8:14 AM  [facility] MAIN [facility] SERVICES [address] [place], [place], [address] Phone: [phone]   Fax:  [phone]   Name: Carlos Stewart MRN: [id number] Date of Birth: [birth year]

## 2020-08-08 ENCOUNTER — Encounter: Payer: Self-pay | Admitting: Speech Pathology

## 2020-08-08 ENCOUNTER — Ambulatory Visit: Payer: BC Managed Care – PPO | Admitting: Speech Pathology

## 2020-08-08 ENCOUNTER — Other Ambulatory Visit: Payer: Self-pay

## 2020-08-08 DIAGNOSIS — R41841 Cognitive communication deficit: Secondary | ICD-10-CM | POA: Diagnosis not present

## 2020-08-08 NOTE — Therapy (Signed)
[place] [facility] MAIN [facility] SERVICES [address] [place], [place], [address] Phone: [phone]   Fax:  [phone]  Speech Language Pathology Treatment  Patient Details  Name: Carlos Stewart MRN: [id number] Date of Birth: 01/11/1986 Referring Provider (SLP): Dr. Malvin Johns   Encounter Date: 08/08/2020   End of Session - 08/08/20 1711    Visit Number 40    Number of Visits 46    Date for SLP Re-Evaluation 08/23/20    Authorization Type BCBS    Authorization Time Period Start 07/05/2020    Authorization - Visit Number 8    Authorization - Number of Visits 10    Progress Note Due on Visit 10    SLP Start Time 1500    SLP Stop Time  1550    SLP Time Calculation (min) 50 min    Activity Tolerance Patient tolerated treatment well           History reviewed. No pertinent past medical history.  History reviewed. No pertinent surgical history.  There were no vitals filed for this visit.   Subjective Assessment - 08/08/20 1710    Subjective increased involvement in progress/ ideas                 ADULT SLP TREATMENT - 08/08/20 0001      General Information   Behavior/Cognition Alert;Cooperative;Pleasant mood    HPI Carlos Stewart is a 34 year old man with post-concussion syndrome with residual visual disturbance, memory loss, and word finding deficits.       Treatment Provided   Treatment provided Cognitive-Linquistic      Cognitive-Linquistic Treatment   Treatment focused on Cognition    Skilled Treatment MEMORY: Notes no longer taking some medication that could impair memory. Recalled new 10 item list at 5, 10 minutes (8, 10) and 20 item list at 5 minutes (20/20) using "chunking" method. INFORMATION PROCESSING: Pt completed 2 Perplexor Puzzle (Level C) independently with 100% acc while being interrupted to recall word lists. 80% acc rapid answers for analogies, explained answers if needed.       Assessment / Recommendations / Plan    Plan Continue with current plan of care            SLP Education - 08/08/20 1710    Education Details strategies, activities to assist in recall/ learning as prep for school    Person(s) Educated Patient    Methods Explanation    Comprehension Verbalized understanding              SLP Long Term Goals - 07/05/20 1116      SLP LONG TERM GOAL #1   Title The patient will complete word finding tasks with 80% accuracy and explain strategies to improve word finding.    Status Achieved      SLP LONG TERM GOAL #2   Title The patient will complete complex memory activities with 80% accuracy.    Status Achieved      SLP LONG TERM GOAL #3   Title The patient will demonstrate reading comprehension for complex written information using study strategies.    Status Achieved      SLP LONG TERM GOAL #4   Title The patient will improve written output speed and accuracy using word processing program.    Status Achieved      SLP LONG TERM GOAL #5   Title Patient will identify cognitive-communication barriers and participate in developing functional compensatory strategies.    Time 8  Period Weeks    Status New    Target Date 08/23/20      Additional Long Term Goals   Additional Long Term Goals Yes      SLP LONG TERM GOAL #6   Title Patient will complete complex executive function skills tasks with 80% accuracy.    Time 8    Period Weeks    Status New    Target Date 08/23/20            Plan - 08/08/20 1712    Clinical Impression Statement Continue to target spaced time reteieval with longer, unrelated word lists incorporating specialized words. Target minimal pairs/ hearing abilities at the word level. Check in with continues memory deficits/ strategies in place. Focus on rapid recall and connections between words/ providing explanations, pt noted some cognitive fatigue from said activities at the end of the session.    Speech Therapy Frequency 2x / week    Duration 8 weeks     Treatment/Interventions Language facilitation;Cognitive reorganization;Patient/family education    Potential to Achieve Goals Good    Potential Considerations Ability to learn/carryover information;Family/community support;Pain level;Previous level of function;Cooperation/participation level;Severity of impairments;Medical prognosis    Consulted and Agree with Plan of Care Patient           Patient will benefit from skilled therapeutic intervention in order to improve the following deficits and impairments:   Cognitive communication deficit    Problem List There are no problems to display for this patient.  [name], BS Graduate Clinician [name] 08/08/2020, 5:13 PM  [place] [facility] MAIN [facility] SERVICES [address] [place], [place], [address] Phone: [phone]   Fax:  [phone]   Name: Carlos Stewart MRN: [id number] Date of Birth: 1986-07-01

## 2020-08-09 ENCOUNTER — Ambulatory Visit: Payer: BC Managed Care – PPO

## 2020-08-09 ENCOUNTER — Other Ambulatory Visit: Payer: Self-pay

## 2020-08-09 DIAGNOSIS — R41841 Cognitive communication deficit: Secondary | ICD-10-CM | POA: Diagnosis not present

## 2020-08-09 DIAGNOSIS — R42 Dizziness and giddiness: Secondary | ICD-10-CM
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
## 2020-08-13 ENCOUNTER — Other Ambulatory Visit: Payer: Self-pay | Admitting: Urology

## 2020-08-14 ENCOUNTER — Ambulatory Visit: Payer: BC Managed Care – PPO | Attending: Neurology | Admitting: Speech Pathology

## 2020-08-14 ENCOUNTER — Encounter: Payer: Self-pay | Admitting: Speech Pathology

## 2020-08-14 ENCOUNTER — Other Ambulatory Visit: Payer: Self-pay

## 2020-08-14 DIAGNOSIS — R41841 Cognitive communication deficit: Secondary | ICD-10-CM | POA: Insufficient documentation

## 2020-08-14 DIAGNOSIS — R2681 Unsteadiness on feet: Secondary | ICD-10-CM | POA: Insufficient documentation

## 2020-08-14 DIAGNOSIS — R252 Cramp and spasm: Secondary | ICD-10-CM | POA: Insufficient documentation

## 2020-08-14 NOTE — Therapy (Signed)
[place] [facility] MAIN [facility] SERVICES [address] [place], [place], [address] Phone: [phone]   Fax:  [phone]  Speech Language Pathology Treatment  Patient Details  Name: Carlos Stewart MRN: [id number] Date of Birth: 09-Oct-1986 Referring Provider (SLP): Dr. Malvin Johns   Encounter Date: 08/14/2020   End of Session - 08/14/20 1716    Visit Number 41    Number of Visits 46    Date for SLP Re-Evaluation 08/23/20    Authorization Type BCBS    Authorization Time Period Start 07/05/2020    Authorization - Visit Number 9    Authorization - Number of Visits 10    Progress Note Due on Visit 10    SLP Start Time 1500    SLP Stop Time  1550    SLP Time Calculation (min) 50 min    Activity Tolerance Patient tolerated treatment well           History reviewed. No pertinent past medical history.  History reviewed. No pertinent surgical history.  There were no vitals filed for this visit.   Subjective Assessment - 08/14/20 1716    Subjective memory/ cognition updates, continue with current set up    Currently in Pain? No/denies                 ADULT SLP TREATMENT - 08/14/20 0001      General Information   Behavior/Cognition Alert;Cooperative;Pleasant mood    HPI Carlos Stewart is a 34 year old man with post-concussion syndrome with residual visual disturbance, memory loss, and word finding deficits.       Treatment Provided   Treatment provided Cognitive-Linquistic      Cognitive-Linquistic Treatment   Treatment focused on Cognition    Skilled Treatment MEMORY: Weaning off of medication that could be impacting memory. Recalled two lists (20 items) at 5, 10, 15 minutes (14, 20, 18 out of 20) and increasing to all 20 given semantic cues. INFORMATION PROCESSING: Manipulated words lists of 4 with 85% acc with some difficulty repeating back correctly. Pt completed 2 Perplexors (Level D) noting crossing out/ rereading strategies, interrupted  throughout to recall word list. 75% acc overall rapid yes/ no questions based on synonyms and analogies (specific features). AUDITORY COMPREHENSION: Identified target out of minimal pairs given images with 100% acc.        Assessment / Recommendations / Plan   Plan Continue with current plan of care      Progression Toward Goals   Progression toward goals Progressing toward goals            SLP Education - 08/14/20 1716    Education Details strategies, cognitive load              SLP Long Term Goals - 07/05/20 1116      SLP LONG TERM GOAL #1   Title The patient will complete word finding tasks with 80% accuracy and explain strategies to improve word finding.    Status Achieved      SLP LONG TERM GOAL #2   Title The patient will complete complex memory activities with 80% accuracy.    Status Achieved      SLP LONG TERM GOAL #3   Title The patient will demonstrate reading comprehension for complex written information using study strategies.    Status Achieved      SLP LONG TERM GOAL #4   Title The patient will improve written output speed and accuracy using word processing program.  Status Achieved      SLP LONG TERM GOAL #5   Title Patient will identify cognitive-communication barriers and participate in developing functional compensatory strategies.    Time 8    Period Weeks    Status New    Target Date 08/23/20      Additional Long Term Goals   Additional Long Term Goals Yes      SLP LONG TERM GOAL #6   Title Patient will complete complex executive function skills tasks with 80% accuracy.    Time 8    Period Weeks    Status New    Target Date 08/23/20            Plan - 08/14/20 1717    Clinical Impression Statement Spaced time reteieval with word/ number lists incorporate specialized words. Continue to check in/ functional activities for school. Focus on rapid recall and processing to increase cognitive load over time. Pt continues to notice some  fatigue by the ends of sessions, activities are challenging. Pt's word recal overall is exceptional utilizing the chunking method.    Speech Therapy Frequency 2x / week    Duration 8 weeks    Treatment/Interventions Language facilitation;Cognitive reorganization;Patient/family education    Potential to Achieve Goals Good    Potential Considerations Ability to learn/carryover information;Family/community support;Pain level;Previous level of function;Cooperation/participation level;Severity of impairments;Medical prognosis    Consulted and Agree with Plan of Care Patient           Patient will benefit from skilled therapeutic intervention in order to improve the following deficits and impairments:   Cognitive communication deficit    Problem List There are no problems to display for this patient.  [name], BS Graduate Clinician [name] 08/14/2020, 5:19 PM  [place] [facility] MAIN [facility] SERVICES [address] [place], [place], [address] Phone: [phone]   Fax:  [phone]   Name: Carlos Stewart MRN: [id number] Date of Birth: 1986/06/04

## 2020-08-16 ENCOUNTER — Other Ambulatory Visit: Payer: Self-pay

## 2020-08-16 ENCOUNTER — Encounter: Payer: Self-pay | Admitting: Speech Pathology

## 2020-08-16 ENCOUNTER — Ambulatory Visit: Payer: BC Managed Care – PPO | Admitting: Speech Pathology

## 2020-08-16 DIAGNOSIS — R41841 Cognitive communication deficit: Secondary | ICD-10-CM | POA: Diagnosis not present

## 2020-08-16 NOTE — Therapy (Signed)
[place] [facility] MAIN [facility] SERVICES [address] [place], [place], [address] Phone: [phone]   Fax:  [phone]  Speech Language Pathology Treatment/Progress Note  Speech Therapy Progress Note   Dates of reporting period  07/05/2020   to   08/16/2020   Patient Details  Name: Carlos Stewart MRN: [id number] Date of Birth: 22-Dec-1985 Referring Provider (SLP): Dr. Malvin Johns   Encounter Date: 08/16/2020   End of Session - 08/16/20 1522    Visit Number 42    Number of Visits 46    Date for SLP Re-Evaluation 08/23/20    Authorization Type BCBS    Authorization Time Period Start 07/05/2020    Authorization - Visit Number 10    Authorization - Number of Visits 10    Progress Note Due on Visit 10    SLP Start Time 1400    SLP Stop Time  1450    SLP Time Calculation (min) 50 min    Activity Tolerance Patient tolerated treatment well           History reviewed. No pertinent past medical history.  History reviewed. No pertinent surgical history.  There were no vitals filed for this visit.   Subjective Assessment - 08/16/20 1520    Subjective improvements in cognition                 ADULT SLP TREATMENT - 08/16/20 0001      General Information   Behavior/Cognition Alert;Cooperative;Pleasant mood    HPI Carlos Stewart is a 34 year old man with post-concussion syndrome with residual visual disturbance, memory loss, and word finding deficits.       Treatment Provided   Treatment provided Cognitive-Linquistic      Cognitive-Linquistic Treatment   Treatment focused on Cognition    Skilled Treatment MEMORY: Weaning off of medication that could be impacting memory. Recalled list of numbers (7) at 5, 10, 20 minutes recalling all numbers each time with numbers out of order at 5 and 20 minutes. INFORMATION PROCESSING: Pt notes that he is able to problem solve with greater ease. Recalled two list of numbers (6, 7) forward, backward, and in  counting order with 75% acc, increasing to 100% given additional readings from clinician. Clinician reread list before requesting each order. Pt completed 1 Perplexor Level D independently, 1 with min cue to reread one clue. Notes greater difficulty. Pt repeated and manipulated 3 word lists with 85% acc, 85% acc repeating and alphabetical order 3 word lists.      Assessment / Recommendations / Plan   Plan Continue with current plan of care      Progression Toward Goals   Progression toward goals Progressing toward goals            SLP Education - 08/16/20 1521    Education Details strategies for info processing, taxing activities due to improvement    Person(s) Educated Patient    Methods Explanation    Comprehension Verbalized understanding              SLP Long Term Goals - 07/05/20 1116      SLP LONG TERM GOAL #1   Title The patient will complete word finding tasks with 80% accuracy and explain strategies to improve word finding.    Status Achieved      SLP LONG TERM GOAL #2   Title The patient will complete complex memory activities with 80% accuracy.    Status Achieved      SLP LONG  TERM GOAL #3   Title The patient will demonstrate reading comprehension for complex written information using study strategies.    Status Achieved      SLP LONG TERM GOAL #4   Title The patient will improve written output speed and accuracy using word processing program.    Status Achieved      SLP LONG TERM GOAL #5   Title Patient will identify cognitive-communication barriers and participate in developing functional compensatory strategies.    Time 8    Period Weeks    Status New    Target Date 08/23/20      Additional Long Term Goals   Additional Long Term Goals Yes      SLP LONG TERM GOAL #6   Title Patient will complete complex executive function skills tasks with 80% accuracy.    Time 8    Period Weeks    Status New    Target Date 08/23/20            Plan -  08/16/20 1522    Clinical Impression Statement Number recall is more difficult when compared to words for pt, continue to target numbers and specialized word lists. Cognitive difficulties/ check in to prepare for school. Increase cognitive load, target recall over time. Overall recall and processing has improved.    Speech Therapy Frequency 2x / week    Duration 8 weeks    Treatment/Interventions Language facilitation;Cognitive reorganization;Patient/family education    Potential to Achieve Goals Good    Potential Considerations Ability to learn/carryover information;Family/community support;Pain level;Previous level of function;Cooperation/participation level;Severity of impairments;Medical prognosis    Consulted and Agree with Plan of Care Patient           Patient will benefit from skilled therapeutic intervention in order to improve the following deficits and impairments:   Cognitive communication deficit    Problem List There are no problems to display for this patient.  Carlos Stewart, BS Graduate Clinician Carlos Stewart 08/16/2020, 3:24 PM  [place] [facility] MAIN [facility] SERVICES [address] [place], [place], [address] Phone: [phone]   Fax:  [phone]   Name: Carlos Stewart MRN: [id number] Date of Birth: [birth year]

## 2020-08-20 ENCOUNTER — Other Ambulatory Visit: Payer: Self-pay

## 2020-08-20 ENCOUNTER — Ambulatory Visit: Payer: BC Managed Care – PPO

## 2020-08-20 DIAGNOSIS — R41841 Cognitive communication deficit: Secondary | ICD-10-CM | POA: Diagnosis not present

## 2020-08-20 DIAGNOSIS — R252 Cramp and spasm: Secondary | ICD-10-CM

## 2020-08-20 DIAGNOSIS — R2681 Unsteadiness on feet: Secondary | ICD-10-CM
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
## 2020-08-20 NOTE — Addendum Note (Signed)
Addended by: Bethanie Dicker on: 08/20/2020 03:06 PM   Modules accepted: Orders

## 2020-08-21 ENCOUNTER — Encounter: Payer: Self-pay | Admitting: Speech Pathology

## 2020-08-21 ENCOUNTER — Ambulatory Visit: Payer: BC Managed Care – PPO | Admitting: Speech Pathology

## 2020-08-21 DIAGNOSIS — R41841 Cognitive communication deficit: Secondary | ICD-10-CM | POA: Diagnosis not present

## 2020-08-21 NOTE — Therapy (Signed)
[facility] MAIN [facility] SERVICES [address] [place], [place], [address] Phone: [phone]   Fax:  [phone]  Speech Language Pathology Treatment  Patient Details  Name: Carlos Stewart MRN: [id number] Date of Birth: 02-Apr-1986 Referring Provider (SLP): Dr. Malvin Johns   Encounter Date: 08/21/2020   End of Session - 08/21/20 1421    Visit Number 43    Number of Visits 46    Date for SLP Re-Evaluation 08/23/20    Authorization Type BCBS    Authorization Time Period Start 07/05/2020    Authorization - Visit Number 1    Authorization - Number of Visits 10    Progress Note Due on Visit 10    SLP Start Time 1320    SLP Stop Time  1410    SLP Time Calculation (min) 50 min    Activity Tolerance Patient tolerated treatment well           History reviewed. No pertinent past medical history.  History reviewed. No pertinent surgical history.  There were no vitals filed for this visit.   Subjective Assessment - 08/21/20 1421    Subjective overall improvements, continue w/ tx, notes comm. breakdown over the phone                 ADULT SLP TREATMENT - 08/21/20 0001      General Information   Behavior/Cognition Alert;Cooperative;Pleasant mood    HPI Carlos Stewart is a 34 year old man with post-concussion syndrome with residual visual disturbance, memory loss, and word finding deficits.       Treatment Provided   Treatment provided Cognitive-Linquistic      Cognitive-Linquistic Treatment   Treatment focused on Cognition    Skilled Treatment MEMORY: Recalled list of 10 specific (related to PT) words at 0, 5, 10 minutes with decreasing accuracy (9, 8, 7). INFORMATION PROCESSING: Pt completed 1 Perplexor Level D independently, 1 with min cue to analyze a decision based on a clue provided. Noted increased difficulty, reminded that it's due to inc difficulty of puzzle. Rapid cognitive processing of two words with specific feature, listing  similarities and differences between words with 100% acc, notes fatigue and waning focus towards the end. Numbers (evens to odds) activity, high acc notes difficulty focusing over time.       Assessment / Recommendations / Plan   Plan Continue with current plan of care      Progression Toward Goals   Progression toward goals Progressing toward goals            SLP Education - 08/21/20 1421    Education Details using strategies, understanding that communication/ processing will be different    Person(s) Educated Patient    Methods Explanation    Comprehension Verbalized understanding              SLP Long Term Goals - 07/05/20 1116      SLP LONG TERM GOAL #1   Title The patient will complete word finding tasks with 80% accuracy and explain strategies to improve word finding.    Status Achieved      SLP LONG TERM GOAL #2   Title The patient will complete complex memory activities with 80% accuracy.    Status Achieved      SLP LONG TERM GOAL #3   Title The patient will demonstrate reading comprehension for complex written information using study strategies.    Status Achieved      SLP LONG TERM GOAL #4  Title The patient will improve written output speed and accuracy using word processing program.    Status Achieved      SLP LONG TERM GOAL #5   Title Patient will identify cognitive-communication barriers and participate in developing functional compensatory strategies.    Time 8    Period Weeks    Status New    Target Date 08/23/20      Additional Long Term Goals   Additional Long Term Goals Yes      SLP LONG TERM GOAL #6   Title Patient will complete complex executive function skills tasks with 80% accuracy.    Time 8    Period Weeks    Status New    Target Date 08/23/20            Plan - 08/21/20 1422    Clinical Impression Statement Continue to focus on number/ word list recall. Increase difficulty/ complexity of what is recalled and encourage pt to  explain processes. Increase cognitive load, introduce distractions. Focus on problem solving and attention. Pt remindd to focus on using strategies for supports instead of expecting prior cognitive functioning without usage of strategies. Continue w/ current plan of care.    Speech Therapy Frequency 2x / week    Duration 8 weeks    Treatment/Interventions Language facilitation;Cognitive reorganization;Patient/family education    Potential to Achieve Goals Good    Potential Considerations Ability to learn/carryover information;Family/community support;Pain level;Previous level of function;Cooperation/participation level;Severity of impairments;Medical prognosis    Consulted and Agree with Plan of Care Patient           Patient will benefit from skilled therapeutic intervention in order to improve the following deficits and impairments:   Cognitive communication deficit    Problem List There are no problems to display for this patient.  [name], BS Graduate Clinician [name] 08/21/2020, 2:24 PM  [place] [facility] MAIN [facility] SERVICES [address] [place], [place], [address] Phone: [phone]   Fax:  [phone]   Name: Carlos Stewart MRN: [id number] Date of Birth: 24-Jul-1986

## 2020-08-23 ENCOUNTER — Other Ambulatory Visit: Payer: Self-pay

## 2020-08-23 ENCOUNTER — Encounter: Payer: Self-pay | Admitting: Speech Pathology

## 2020-08-23 ENCOUNTER — Ambulatory Visit: Payer: BC Managed Care – PPO | Admitting: Speech Pathology

## 2020-08-23 DIAGNOSIS — R41841 Cognitive communication deficit: Secondary | ICD-10-CM
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
## 2020-08-28 ENCOUNTER — Encounter: Payer: Self-pay | Admitting: Speech Pathology

## 2020-08-28 ENCOUNTER — Other Ambulatory Visit: Payer: Self-pay

## 2020-08-28 ENCOUNTER — Ambulatory Visit: Payer: BC Managed Care – PPO | Admitting: Speech Pathology

## 2020-08-28 DIAGNOSIS — R41841 Cognitive communication deficit: Secondary | ICD-10-CM

## 2020-08-28 NOTE — Therapy (Signed)
[place] MAIN [facility] SERVICES [address] [place], [place], [address] Phone: [phone]   Fax:  [phone]  Speech Language Pathology Treatment/Re-Certification  Patient Details  Name: Carlos Stewart MRN: [id number] Date of Birth: 25-Jun-1986 Referring Provider (SLP): Dr. Melrose Nakayama   Encounter Date: 08/28/2020   End of Session - 08/28/20 1628    Visit Number 45    Number of Visits 46    Date for SLP Re-Evaluation 10/11/20    Authorization Type BCBS    Authorization Time Period Start 07/05/2020    Authorization - Visit Number 3    Authorization - Number of Visits 10    Progress Note Due on Visit 10    SLP Start Time 1400    SLP Stop Time  1450    SLP Time Calculation (min) 50 min    Activity Tolerance Patient tolerated treatment well           History reviewed. No pertinent past medical history.  History reviewed. No pertinent surgical history.  There were no vitals filed for this visit.   Subjective Assessment - 08/28/20 1627    Subjective executive function/ short term memory deficits from psych report                   SLP Education - 08/28/20 1628    Education Details bring report, continue with hw and report next week    Person(s) Educated Patient    Methods Explanation    Comprehension Verbalized understanding              SLP Long Term Goals - 08/29/20 1139      SLP LONG TERM GOAL #5   Title Patient will identify cognitive-communication barriers and participate in developing functional compensatory strategies.    Time 6    Period Weeks    Status Partially Met    Target Date 10/11/20      SLP LONG TERM GOAL #6   Title Patient will complete complex executive function skills tasks with 80% accuracy.    Time 6    Period Weeks    Status Partially Met    Target Date 10/11/20            Plan - 08/28/20 1629    Clinical Impression Statement The patient is making progress towards goals but  continues to experience cognitive communication impairment.  Will continue ST with updated goals.  Continue to focus on overall executive functioning and recalling information in distracting environments. Increase difficulty/ randomness of recalled words to challenge. Recall what he has learned throughout the week. Encourage complex, creative answers for rapid cognitive processing. Continue to use supports, increasing mental endurance during sessions.    Speech Therapy Frequency 2x / week    Duration 8 weeks    Treatment/Interventions Language facilitation;Cognitive reorganization;Patient/family education    Potential to Achieve Goals Good    Potential Considerations Ability to learn/carryover information;Family/community support;Pain level;Previous level of function;Cooperation/participation level;Severity of impairments;Medical prognosis    Consulted and Agree with Plan of Care Patient           Patient will benefit from skilled therapeutic intervention in order to improve the following deficits and impairments:   Cognitive communication deficit - Plan: SLP plan of care cert/re-cert    Problem List There are no problems to display for this patient.  Lawernce Pitts, BS Graduate Clinician Lou Miner 08/29/2020, 11:42 AM  [place] MAIN [facility] SERVICES [address]  [place], [place], [address] Phone: [phone]   Fax:  [phone]   Name: JEF FUTCH MRN: [id number] Date of Birth: [birth year]

## 2020-08-30 ENCOUNTER — Ambulatory Visit: Payer: BC Managed Care – PPO

## 2020-08-30 ENCOUNTER — Other Ambulatory Visit: Payer: Self-pay

## 2020-08-30 DIAGNOSIS — R252 Cramp and spasm: Secondary | ICD-10-CM

## 2020-08-30 DIAGNOSIS — R41841 Cognitive communication deficit: Secondary | ICD-10-CM | POA: Diagnosis not present

## 2020-08-30 DIAGNOSIS — R2681 Unsteadiness on feet: Secondary | ICD-10-CM
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
## 2020-09-03 ENCOUNTER — Encounter: Payer: Self-pay | Admitting: Speech Pathology

## 2020-09-03 ENCOUNTER — Ambulatory Visit: Payer: BC Managed Care – PPO | Admitting: Speech Pathology

## 2020-09-03 ENCOUNTER — Other Ambulatory Visit: Payer: Self-pay

## 2020-09-03 DIAGNOSIS — R41841 Cognitive communication deficit: Secondary | ICD-10-CM | POA: Diagnosis not present

## 2020-09-03 NOTE — Therapy (Signed)
[place] [facility] SERVICES [address] [place], [place], [address] Phone: [phone]   Fax:  [phone]  Speech Language Pathology Treatment  Patient Details  Name: AIRAM HEIDECKER MRN: [id number] Date of Birth: August 03, 1986 Referring Provider (SLP): Dr. Melrose Nakayama   Encounter Date: 09/03/2020   End of Session - 09/03/20 1718    Visit Number 46    Number of Visits 22    Date for SLP Re-Evaluation 10/11/20    Authorization Type BCBS    Authorization Time Period Start 07/05/2020    Authorization - Visit Number 4    Authorization - Number of Visits 10    Progress Note Due on Visit 10    SLP Start Time 1500    SLP Stop Time  1550    SLP Time Calculation (min) 50 min    Activity Tolerance Patient tolerated treatment well           History reviewed. No pertinent past medical history.  History reviewed. No pertinent surgical history.  There were no vitals filed for this visit.   Subjective Assessment - 09/03/20 1717    Subjective positive communication stories, used supportivr strategies    Currently in Pain? No/denies                 ADULT SLP TREATMENT - 09/03/20 0001      General Information   Behavior/Cognition Alert;Cooperative;Pleasant mood    HPI Pedro Whiters is a 34 year old man with post-concussion syndrome with residual visual disturbance, memory loss, and word finding deficits.       Treatment Provided   Treatment provided Cognitive-Linquistic      Cognitive-Linquistic Treatment   Treatment focused on Cognition    Skilled Treatment MEMORY: Recalled list of 10 random words compiled by pt and clinician at 0, 5, 10 min in between various activities with 100%, 90%, and 90%. Responded with "blanket" for "quilt", provided "quilt" when given min cue for "a kind of blanket". Had difficulty recalling information from TED Talk he watched last week, will take notes next time. Remembered talking points for discussion with  lawyer during the week. INFORMATION PROCESSING: Completed one Perplexor (Level D) with some difficulty, stated that inferencing/ guessing was necessary. Utilized note taking and crossing off strategies. Word list manipulation 4 at a time, 100% acc recall and 90% acc answering questions. With 5 at a time, 67% acc recalling and 100% acc answering questions. Provided solutions/ next steps for unusual situations/ problems. Demonstrated some difficulty, provided little detail.       Assessment / Recommendations / Plan   Plan Continue with current plan of care      Progression Toward Goals   Progression toward goals Progressing toward goals            SLP Education - 09/03/20 1717    Education Details continue [place]    Person(s) Educated Patient    Methods Explanation    Comprehension Verbalized understanding              SLP Long Term Goals - 08/29/20 1139      SLP LONG TERM GOAL #5   Title Patient will identify cognitive-communication barriers and participate in developing functional compensatory strategies.    Time 6    Period Weeks    Status Partially Met    Target Date 10/11/20      SLP LONG TERM GOAL #6   Title Patient will complete complex executive function skills tasks with 80%  accuracy.    Time 6    Period Weeks    Status Partially Met    Target Date 10/11/20            Plan - 09/03/20 1718    Clinical Impression Statement Continue targeted ST under current POC. Focus mainly on executive functioning, challenging activities "switching" back and forth with distractions. Increase # of words in recall. Continue to incorporate word manipulation to gauge memory and info processing. Encourage detail and creativity in rapid cognitive processing answers. Use supports throughout week.    Speech Therapy Frequency 2x / week    Duration 8 weeks    Treatment/Interventions Language facilitation;Cognitive reorganization;Patient/family education    Potential to Achieve Goals  Good    Potential Considerations Ability to learn/carryover information;Family/community support;Pain level;Previous level of function;Cooperation/participation level;Severity of impairments;Medical prognosis    Consulted and Agree with Plan of Care Patient           Patient will benefit from skilled therapeutic intervention in order to improve the following deficits and impairments:   Cognitive communication deficit    Problem List There are no problems to display for this patient.  Lawernce Pitts, BS Graduate Clinician Lawernce Pitts 09/03/2020, 5:20 PM  [place] MAIN [facility] SERVICES [address] [place], [place], [address] Phone: [phone]   Fax:  [phone]   Name: ONOFRE GAINS MRN: [id number] Date of Birth: 1986-01-13

## 2020-09-11 ENCOUNTER — Encounter: Payer: Self-pay | Admitting: Speech Pathology

## 2020-09-11 ENCOUNTER — Ambulatory Visit: Payer: BC Managed Care – PPO | Admitting: Speech Pathology

## 2020-09-11 ENCOUNTER — Other Ambulatory Visit: Payer: Self-pay

## 2020-09-11 DIAGNOSIS — R41841 Cognitive communication deficit: Secondary | ICD-10-CM

## 2020-09-11 NOTE — Therapy (Signed)
[place] MAIN [facility] SERVICES [address] [place], [place], [address] Phone: [phone]   Fax:  [phone]  Speech Language Pathology Treatment  Patient Details  Name: Carlos Stewart MRN: [id number] Date of Birth: [birth year] Referring Provider (SLP): Dr. Melrose Nakayama   Encounter Date: 09/11/2020   End of Session - 09/11/20 1624    Visit Number 72    Number of Visits 29    Date for SLP Re-Evaluation 10/11/20    Authorization Type BCBS    Authorization Time Period Start 07/05/2020    Authorization - Visit Number 5    Authorization - Number of Visits 10    Progress Note Due on Visit 10    SLP Start Time 1500    SLP Stop Time  1550    SLP Time Calculation (min) 50 min    Activity Tolerance Patient tolerated treatment well           History reviewed. No pertinent past medical history.  History reviewed. No pertinent surgical history.  There were no vitals filed for this visit.   Subjective Assessment - 09/11/20 1623    Subjective using strategies when needed, continued practice    Currently in Pain? No/denies                 ADULT SLP TREATMENT - 09/11/20 0001      General Information   Behavior/Cognition Alert;Cooperative;Pleasant mood    HPI Carlos Stewart is a 34 year old man with post-concussion syndrome with residual visual disturbance, memory loss, and word finding deficits.       Treatment Provided   Treatment provided Cognitive-Linquistic      Cognitive-Linquistic Treatment   Treatment focused on Cognition    Skilled Treatment MEMORY: Recalled list of 12 random words compiled by pt and clinician at 83% acc at each interval (0. 5, 10) inc to 100% given min cues about remaining words. Recalled communication successes and breakdowns with family this past week, breakdowns independent of topic or knowledge about topic. INFORMATION PROCESSING: Completed 2 Perplexors (Level D), one independently and 1 with min-mod cues from  clinician. Encouraged by clinician to not guess, and instead pause and re-evaluate cues. Word list manipulation 4 at a time, 95% acc recall and 100% acc answering questions. With 5 at a time, only 5 trials, 40% acc recalling and 60% acc answering questions.       Assessment / Recommendations / Plan   Plan Continue with current plan of care      Progression Toward Goals   Progression toward goals Progressing toward goals            SLP Education - 09/11/20 1624    Education Details continue [place], updates of comm breakdowns when they occur    Person(s) Educated Patient    Methods Explanation    Comprehension Verbalized understanding              SLP Long Term Goals - 08/29/20 1139      SLP LONG TERM GOAL #5   Title Patient will identify cognitive-communication barriers and participate in developing functional compensatory strategies.    Time 6    Period Weeks    Status Partially Met    Target Date 10/11/20      SLP LONG TERM GOAL #6   Title Patient will complete complex executive function skills tasks with 80% accuracy.    Time 6    Period Weeks    Status Partially Met  Target Date 10/11/20            Plan - 09/11/20 1626    Clinical Impression Statement Continue focus on executive functioning, working through cognitive load/ potential fatigue. HW at home taking notes and recalling info next session (TEDTalk). Continue word manipulation to ensure auditory processing is adequate and memory/ info processing. Use supports.    Speech Therapy Frequency 2x / week    Duration 8 weeks    Treatment/Interventions Language facilitation;Cognitive reorganization;Patient/family education    Potential to Achieve Goals Good    Potential Considerations Ability to learn/carryover information;Family/community support;Pain level;Previous level of function;Cooperation/participation level;Severity of impairments;Medical prognosis    Consulted and Agree with Plan of Care Patient            Patient will benefit from skilled therapeutic intervention in order to improve the following deficits and impairments:   Cognitive communication deficit    Problem List There are no problems to display for this patient.  [place], [place] Graduate Clinician Lawernce Pitts 09/11/2020, 4:27 PM  [place] MAIN [facility] SERVICES [address] [place], [place], [address] Phone: [phone]   Fax:  [phone]   Name: Carlos Stewart MRN: [id number] Date of Birth: [birth year]

## 2020-09-13 ENCOUNTER — Ambulatory Visit: Payer: BC Managed Care – PPO

## 2020-09-13 ENCOUNTER — Other Ambulatory Visit: Payer: Self-pay

## 2020-09-13 ENCOUNTER — Ambulatory Visit: Payer: BC Managed Care – PPO | Attending: Neurology | Admitting: Speech Pathology

## 2020-09-13 DIAGNOSIS — R252 Cramp and spasm: Secondary | ICD-10-CM | POA: Insufficient documentation

## 2020-09-13 DIAGNOSIS — R2681 Unsteadiness on feet: Secondary | ICD-10-CM

## 2020-09-13 DIAGNOSIS — R41841 Cognitive communication deficit: Secondary | ICD-10-CM

## 2020-09-13 NOTE — Therapy (Signed)
[place] MAIN [facility] SERVICES [address] [place], [place], [address] Phone: [phone]   Fax:  [phone]  Speech Language Pathology Treatment  Patient Details  Name: Carlos Stewart MRN: [id number] Date of Birth: Dec 03, 1985 Referring Provider (SLP): Dr. Melrose Nakayama   Encounter Date: 09/13/2020   End of Session - 09/13/20 1622    Visit Number 48    Number of Visits 28    Date for SLP Re-Evaluation 10/11/20    Authorization Type BCBS    Authorization Time Period Start 07/05/2020    Authorization - Visit Number 6    Authorization - Number of Visits 10    Progress Note Due on Visit 10    SLP Start Time 0938    SLP Stop Time  1453    SLP Time Calculation (min) 55 min    Activity Tolerance Patient tolerated treatment well           No past medical history on file.  No past surgical history on file.  There were no vitals filed for this visit.   Subjective Assessment - 09/13/20 1614    Subjective "I'm able to find words easier."    Currently in Pain? No/denies                 ADULT SLP TREATMENT - 09/13/20 1400      General Information   Behavior/Cognition Alert;Cooperative;Pleasant mood    HPI Carlos Stewart is a 34 year old man with post-concussion syndrome with residual visual disturbance, memory loss, and word finding deficits.       Treatment Provided   Treatment provided Cognitive-Linquistic      Cognitive-Linquistic Treatment   Treatment focused on Cognition    Skilled Treatment EXECUTIVE FUNCTION/ANTICIPATORY AWARENESS: SLP facilitated discussion with patient regarding current goals and barriers. Patient reported success overall with wordfinding, but notes he has yet to be challenged in a high pressure situation such as speaking up in class or giving a presentation. Educated that pt may be more likely to have difficulties in these scenarios making use of compensations important. Carlos Stewart hopes to audit classes he has  already completed in the hopes that he can slowly return to school, if he is cleared for this medically. He required usual mod questioning cues to list barriers as well as possible strategies he could implement, as well as accomodations he might need to request in order to succeed in this. We discussed accommodations such as reduced work load, audio recordings of lectures, more frequent breaks, and receiving notes/outlines in advance may be appropriate. Provided information for vocational rehabilitation services. INFORMATION PROCESSING: Carlos Stewart read PT case study and took notes, recalled details 100% accuracy. Generated 3 appropriate questions to ask pt, although list was incomplete.       Assessment / Recommendations / Plan   Plan Continue with current plan of care      Progression Toward Goals   Progression toward goals Progressing toward goals            SLP Education - 09/13/20 1621    Education Details voc rehab, functional activities to work on info processing/executive function at home, accommodations likely necessary if pt hopes to return to PT program    Person(s) Educated Patient    Methods Explanation;Handout    Comprehension Verbalized understanding              SLP Long Term Goals - 08/29/20 1139      SLP LONG TERM GOAL #5  Title Patient will identify cognitive-communication barriers and participate in developing functional compensatory strategies.    Time 6    Period Weeks    Status Partially Met    Target Date 10/11/20      SLP LONG TERM GOAL #6   Title Patient will complete complex executive function skills tasks with 80% accuracy.    Time 6    Period Weeks    Status Partially Met    Target Date 10/11/20            Plan - 09/13/20 1623    Clinical Impression Statement Patient appeared receptive to requesting accommodations and using strategies, but mod cues required to identify some possibilities. Continue skilled ST with focus on executive functioning,  compensations for attention and sensory hyperstimulation, management of cognitive symptoms. Patient may benefit from consulting vocational rehabilitation; information provided in handout form (see pt instructions).    Speech Therapy Frequency 2x / week    Duration 8 weeks    Treatment/Interventions Language facilitation;Cognitive reorganization;Patient/family education    Potential to Achieve Goals Good    Potential Considerations Ability to learn/carryover information;Family/community support;Pain level;Previous level of function;Cooperation/participation level;Severity of impairments;Medical prognosis    Consulted and Agree with Plan of Care Patient           Patient will benefit from skilled therapeutic intervention in order to improve the following deficits and impairments:   Cognitive communication deficit    Problem List There are no problems to display for this patient.  Deneise Lever, [place], CCC-SLP Speech-Language Pathologist  Aliene Altes 09/13/2020, 4:25 PM  [place] MAIN [facility] SERVICES [address] [place], [place], [address] Phone: [phone]   Fax:  [phone]   Name: Carlos Stewart MRN: [id number] Date of Birth: [birth year]

## 2020-09-13 NOTE — Patient Instructions (Signed)
Keep working on your list of barriers, accommodations and strategies. Bring with you next time.   Vocational Rehabilitation: [email or web address]  [place] [phone] [address] [phone]   Tips to help facilitate better attention, concentration, focus   Do harder, longer tasks when you are most alert/awake  Break down larger tasks into small parts  Limit distractions of TV, radio, conversation, e mails/texts, appliance noise, etc - if a job is important, do it in a quiet room  Be aware of how you are functioning in high stimulation environments such as large stores, parties, restaurants - any place with lots of lights, noise, signs etc  Group conversations may be more difficult to process than one on one conversations  Give yourself extra time to process conversation, reading materials, directions or information from your healthcare providers  Organization is key - clutters of laundry, mail, paperwork, dirty dishes - all make it more difficult to concentrate  Before you start a task, have all the needed supplies, directions, recipes ready and organized. This way you don't have to go looking for something in the middle of a task and become distracted.   Be aware of fatigue - take rests or breaks when needed to re-group and re-focus
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
## 2020-09-18 ENCOUNTER — Other Ambulatory Visit: Payer: Self-pay

## 2020-09-18 ENCOUNTER — Ambulatory Visit: Payer: BC Managed Care – PPO | Admitting: Speech Pathology

## 2020-09-18 DIAGNOSIS — R41841 Cognitive communication deficit: Secondary | ICD-10-CM

## 2020-09-18 NOTE — Therapy (Signed)
[place] [facility] [address] [place], [place], [address] Phone: [phone]   Fax:  [phone]  Speech Language Pathology Treatment  Patient Details  Name: Carlos Stewart MRN: [id number] Date of Birth: 01/09/1986 Referring Provider (SLP): Dr. Potter   Encounter Date: 09/18/2020   End of Session - 09/18/20 1637    Visit Number 49    Number of Visits 67    Date for SLP Re-Evaluation 10/11/20    Authorization Type BCBS    Authorization Time Period Start 07/05/2020    Authorization - Visit Number 7    Authorization - Number of Visits 10    SLP Start Time 1503    SLP Stop Time  1555    SLP Time Calculation (min) 52 min    Activity Tolerance Patient tolerated treatment well           No past medical history on file.  No past surgical history on file.  There were no vitals filed for this visit.   Subjective Assessment - 09/18/20 1622    Subjective "I was in kind of a stressful situation."    Currently in Pain? No/denies                 ADULT SLP TREATMENT - 09/18/20 1503      General Information   Behavior/Cognition Alert;Cooperative;Pleasant mood    HPI Aleks Brede is a 34-year-old man with post-concussion syndrome with residual visual disturbance, memory loss, and word finding deficits.       Treatment Provided   Treatment provided Cognitive-Linquistic      Pain Assessment   Pain Assessment No/denies pain      Cognitive-Linquistic Treatment   Treatment focused on Cognition    Skilled Treatment STRATEGY TRAINING/ANTICIPATORY AWARENESS: Patient typed and added to his list of barriers at home and listed appropriate accommodations and strategies. Pt included barriers such as "small talk", note content, processing and sequencing information. INFORMATION PROCESSING: Patient reported "drawing blanks" when meeting a date for the first time. Pt generated strategy of moving to a quieter place as something he could  have done differently. Therapist suggested pre-planning by preparing a list of open-ended/conversational questions in his phone that he could reference discreetly after excusing himself to regroup. Educated on rephrasing, repeating back parts of the speaker's comments to allow more time for processing and to assist with generating follow-up questions. Role-played small talk conversation with burden placed on pt to drive the conversation. He preplanned questions, referenced his list when necessary with only brief hesitations. Moderate cues occasionally for eye contact.       Assessment / Recommendations / Plan   Plan Continue with current plan of care      Progression Toward Goals   Progression toward goals Progressing toward goals            SLP Education - 09/18/20 1637    Education Details preplanning for high pressure conversations    Person(s) Educated Patient    Methods Explanation    Comprehension Verbalized understanding              SLP Long Term Goals - 08/29/20 1139      SLP LONG TERM GOAL #5   Title Patient will identify cognitive-communication barriers and participate in developing functional compensatory strategies.    Time 6    Period Weeks    Status Partially Met    Target Date 10/11/20      SLP LONG TERM GOAL #  6   Title Patient will complete complex executive function skills tasks with 80% accuracy.    Time 6    Period Weeks    Status Partially Met    Target Date 10/11/20            Plan - 09/18/20 1638    Clinical Impression Statement Patient generated appropriate list of barriers, accommodations and strategies. Had some difficulty communicating in higher pressure situation over the weekend, however was receptive to using strategies to reduce this, and verbalized several activities he could do to work on these skills outside of therapy. Strongly suspect anxiety is a contributing factor, along with slower processing/wordfinding difficulties. Patient is  progressing toward goals and I anticipate d/c in next 1-3 sessions.    Speech Therapy Frequency 2x / week    Duration 8 weeks    Treatment/Interventions Language facilitation;Cognitive reorganization;Patient/family education    Potential to Achieve Goals Good    Potential Considerations Ability to learn/carryover information;Family/community support;Pain level;Previous level of function;Cooperation/participation level;Severity of impairments;Medical prognosis    Consulted and Agree with Plan of Care Patient           Patient will benefit from skilled therapeutic intervention in order to improve the following deficits and impairments:   Cognitive communication deficit    Problem List There are no problems to display for this patient.  Deneise Lever, [place], CCC-SLP Speech-Language Pathologist   Aliene Altes 09/18/2020, 4:47 PM  [place] MAIN [facility] SERVICES [address] [place], [place], [address] Phone: [phone]   Fax:  [phone]   Name: CRANSTON KOORS MRN: [id number] Date of Birth: December 18, 1985

## 2020-09-20 ENCOUNTER — Ambulatory Visit: Payer: BC Managed Care – PPO | Admitting: Speech Pathology

## 2020-09-20 ENCOUNTER — Other Ambulatory Visit: Payer: Self-pay

## 2020-09-20 ENCOUNTER — Encounter: Payer: BC Managed Care – PPO | Admitting: Speech Pathology

## 2020-09-20 DIAGNOSIS — R41841 Cognitive communication deficit: Secondary | ICD-10-CM

## 2020-09-20 NOTE — Therapy (Signed)
[place] MAIN [facility] SERVICES [address] [place], [place], [address] Phone: [phone]   Fax:  [phone]  Speech Language Pathology Treatment and Discharge Summary  Patient Details  Name: Carlos Stewart MRN: [id number] Date of Birth: October 28, 1985 Referring Provider (SLP): Dr. Melrose Nakayama   Encounter Date: 09/20/2020   End of Session - 09/20/20 1715    Visit Number 50    Number of Visits 22    Date for SLP Re-Evaluation 10/11/20    Authorization Type BCBS    SLP Start Time 7824    SLP Stop Time  1458    SLP Time Calculation (min) 56 min    Activity Tolerance Patient tolerated treatment well           No past medical history on file.  No past surgical history on file.  There were no vitals filed for this visit.   Subjective Assessment - 09/20/20 1713    Subjective Arrives with medical clearance form    Currently in Pain? No/denies                 ADULT SLP TREATMENT - 09/20/20 1402      General Information   Behavior/Cognition Alert;Cooperative;Pleasant mood    HPI Carlos Stewart is a 34 year old man with post-concussion syndrome with residual visual disturbance, memory loss, and word finding deficits.       Treatment Provided   Treatment provided Cognitive-Linquistic      Pain Assessment   Pain Assessment No/denies pain      Cognitive-Linquistic Treatment   Treatment focused on Cognition    Skilled Treatment Targeted use of pre-planning, strategies to facilitate conversation in and outside of [place] room today. With pre-planning period, pt led and maintained 20 minute conversation in and outside of therapy room in cafeteria. Cue for eye contact required x2. 2 brief lapses in conversation, during which pt referenced his notes and restarted the conversation. SLP provided feedback on some of pt's questions, and with min question cues he was able to revise some of his questions to elicit more open-ended responses.       Assessment / Recommendations / Plan   Plan All goals met;Discharge SLP treatment due to (comment)      Progression Toward Goals   Progression toward goals Goals met, education completed, patient discharged from Carlos Stewart - 09/20/20 1714      SLP LONG TERM GOAL #5   Title Patient will identify cognitive-communication barriers and participate in developing functional compensatory strategies.    Time 6    Period Weeks    Status Achieved      SLP LONG TERM GOAL #6   Title Patient will complete complex executive function skills tasks with 80% accuracy.    Time 6    Period Weeks    Status Achieved            Plan - 09/20/20 1716    Clinical Impression Statement Carlos Stewart demonstrates awareness of need to use accommodations and strategies in cognitive tasks, as well as ability to identify barriers and appropriate strategies. He is able to initiate and maintain "small talk" functionally today with brief pre-planning period. He was encouraged to follow up with neuropsychology for testing, as this may help him to better manage the psycho-emotional aspects of his brain injury. Patient is advised to consult with his MD re: return to school;  from a cognitive standpoint I advised a graduated return, in consultation with his department to develop plan for accommodations such as notetaker, recording lessons, reduced workload, more frequent breaks, extended time for tests and assignments. Patient is in agreement with d/c today.    Speech Therapy Frequency --   d/c   Duration --   d/c   Treatment/Interventions Language facilitation;Cognitive reorganization;Patient/family education    Potential to Achieve Goals Good    Potential Considerations Ability to learn/carryover information;Family/community support;Pain level;Previous level of function;Cooperation/participation level;Severity of impairments;Medical prognosis    Consulted and Agree with Plan of Care Patient            Patient will benefit from skilled therapeutic intervention in order to improve the following deficits and impairments:   Cognitive communication deficit    Problem List There are no problems to display for this patient.  SPEECH THERAPY DISCHARGE SUMMARY  Visits from Start of Care: 50  Current functional level related to goals / functional outcomes: Patient has met 6/6/ LTGs. See clinical impressions above for details re: current functional level.   Remaining deficits: Patient continues with mild cognitive impairments necessitating adaptations and strategies for higher-level functions such as complex attention and executive function.   Education / Equipment: Engineer, materials and strategies necessary for pt to return to school Plan: Patient agrees to discharge.  Patient goals were met. Patient is being discharged due to meeting the stated rehab goals.  ?????        Deneise Lever, [place], CCC-SLP Speech-Language Pathologist  Aliene Altes 09/20/2020, 5:31 PM  [place] MAIN [facility] SERVICES [address] [place], [place], [address] Phone: [phone]   Fax:  [phone]   Name: Carlos Stewart MRN: [id number] Date of Birth: [birth year]

## 2020-09-21 ENCOUNTER — Other Ambulatory Visit: Payer: Self-pay | Admitting: Urology

## 2020-09-21 ENCOUNTER — Encounter: Payer: Self-pay | Admitting: Urology

## 2020-09-22 ENCOUNTER — Other Ambulatory Visit: Payer: Self-pay | Admitting: Urology

## 2020-09-24 ENCOUNTER — Other Ambulatory Visit: Payer: Self-pay | Admitting: *Deleted

## 2020-09-24 MED ORDER — TADALAFIL 20 MG PO TABS: ORAL_TABLET | ORAL | 0 refills | Status: DC

## 2020-09-27 ENCOUNTER — Other Ambulatory Visit: Payer: Self-pay

## 2020-09-27 ENCOUNTER — Encounter: Payer: BC Managed Care – PPO | Admitting: Speech Pathology

## 2020-09-27 ENCOUNTER — Ambulatory Visit: Payer: BC Managed Care – PPO

## 2020-09-27 DIAGNOSIS — R41841 Cognitive communication deficit: Secondary | ICD-10-CM

## 2020-09-27 DIAGNOSIS — R2681 Unsteadiness on feet: Secondary | ICD-10-CM

## 2020-09-27 DIAGNOSIS — R252 Cramp and spasm: Secondary | ICD-10-CM
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
## 2020-10-02 ENCOUNTER — Encounter: Payer: BC Managed Care – PPO | Admitting: Speech Pathology

## 2020-10-16 ENCOUNTER — Other Ambulatory Visit: Payer: Self-pay | Admitting: Urology

## 2020-10-16 MED ORDER — TADALAFIL 20 MG PO TABS: ORAL_TABLET | ORAL | 0 refills | Status: DC

## 2020-11-06 ENCOUNTER — Ambulatory Visit: Payer: BC Managed Care – PPO | Attending: Neurology

## 2020-11-06 ENCOUNTER — Other Ambulatory Visit: Payer: Self-pay

## 2020-11-06 DIAGNOSIS — R252 Cramp and spasm: Secondary | ICD-10-CM | POA: Insufficient documentation

## 2020-11-06 DIAGNOSIS — R42 Dizziness and giddiness: Secondary | ICD-10-CM | POA: Insufficient documentation
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
## 2020-11-21 ENCOUNTER — Other Ambulatory Visit: Payer: Self-pay | Admitting: Urology

## 2020-11-21 MED ORDER — TADALAFIL 20 MG PO TABS: ORAL_TABLET | ORAL | 0 refills | Status: DC

## 2020-12-17 ENCOUNTER — Other Ambulatory Visit: Payer: Self-pay | Admitting: Urology

## 2020-12-17 MED ORDER — TADALAFIL 20 MG PO TABS: ORAL_TABLET | ORAL | 0 refills | Status: DC

## 2021-02-12 ENCOUNTER — Other Ambulatory Visit: Payer: Self-pay | Admitting: Urology

## 2021-02-12 MED ORDER — TADALAFIL 20 MG PO TABS: ORAL_TABLET | ORAL | 0 refills | Status: DC

## 2021-03-05 ENCOUNTER — Ambulatory Visit (LOCAL_COMMUNITY_HEALTH_CENTER): Payer: PRIVATE HEALTH INSURANCE

## 2021-03-05 ENCOUNTER — Other Ambulatory Visit: Payer: Self-pay

## 2021-03-05 DIAGNOSIS — Z111 Encounter for screening for respiratory tuberculosis: Secondary | ICD-10-CM

## 2021-03-08 ENCOUNTER — Ambulatory Visit (LOCAL_COMMUNITY_HEALTH_CENTER): Payer: PRIVATE HEALTH INSURANCE

## 2021-03-08 DIAGNOSIS — Z111 Encounter for screening for respiratory tuberculosis: Secondary | ICD-10-CM

## 2021-03-08 LAB — TB SKIN TEST
Induration: 7 mm
TB Skin Test: NEGATIVE

## 2021-04-01 ENCOUNTER — Other Ambulatory Visit: Payer: Self-pay | Admitting: Urology

## 2021-04-01 MED ORDER — TADALAFIL 20 MG PO TABS: ORAL_TABLET | ORAL | 0 refills | Status: DC

## 2021-04-02 ENCOUNTER — Other Ambulatory Visit: Payer: Self-pay

## 2021-04-02 ENCOUNTER — Ambulatory Visit (LOCAL_COMMUNITY_HEALTH_CENTER): Payer: PRIVATE HEALTH INSURANCE

## 2021-04-02 DIAGNOSIS — Z23 Encounter for immunization: Secondary | ICD-10-CM

## 2021-04-02 DIAGNOSIS — Z111 Encounter for screening for respiratory tuberculosis: Secondary | ICD-10-CM

## 2021-04-02 NOTE — Progress Notes (Signed)
2nd Step PPD placed today. Also requests Tdap and flu vaccines for [employer] [facility] PT school requirements. States school requires Tdap within 5 yrs. Per [facility], last Tdap 2015. Phone consult Dr Ralene Bathe who gives ok to proceed with Tdap today. Tolerated Tdap and flu vaccine well today. Updated [facility] copy given. PPDR scheduled 04/05/2021, pt has reminder. Jerel Shepherd, RN

## 2021-04-03 ENCOUNTER — Ambulatory Visit: Payer: Self-pay

## 2021-04-05 ENCOUNTER — Ambulatory Visit (LOCAL_COMMUNITY_HEALTH_CENTER): Payer: PRIVATE HEALTH INSURANCE

## 2021-04-05 ENCOUNTER — Other Ambulatory Visit: Payer: Self-pay

## 2021-04-05 DIAGNOSIS — Z111 Encounter for screening for respiratory tuberculosis: Secondary | ICD-10-CM

## 2021-04-05 LAB — TB SKIN TEST
Induration: 0 mm
TB Skin Test: NEGATIVE

## 2021-04-11 ENCOUNTER — Other Ambulatory Visit: Payer: Self-pay

## 2021-04-11 ENCOUNTER — Ambulatory Visit (LOCAL_COMMUNITY_HEALTH_CENTER): Payer: Self-pay

## 2021-04-11 DIAGNOSIS — Z0184 Encounter for antibody response examination: Secondary | ICD-10-CM

## 2021-04-11 NOTE — Progress Notes (Signed)
In Nurse Clinic for Varicella titer and Hep B quantitative titer as needed for PT school. Pt reports hx of chickenpox. ROI signed and pt walked to lab for venipuncture. Pt states he can pick up copy of titer results at immunization appt on 04/17/2021. Jerel Shepherd, RN

## 2021-04-12 LAB — HEPATITIS B SURFACE ANTIBODY, QUANTITATIVE: Hepatitis B Surf Ab Quant: >1000 m[IU]/mL (ref >9.9)

## 2021-04-12 LAB — VARICELLA ZOSTER ANTIBODY, IGG: Varicella zoster IgG: 276 index (ref >165)

## 2021-04-16 ENCOUNTER — Telehealth: Payer: Self-pay

## 2021-04-16 NOTE — Telephone Encounter (Signed)
Phone call to pt. Discussed titers for Hep B and Varicella. Pt states even though he has come back immune for Hep B, his school requires him to have a 3rd documented Hep B vaccine. 2nd vaccine documented was in August 2016 in [place].

## 2021-04-16 NOTE — Telephone Encounter (Signed)
-----   Message from William Hamburger, RN sent at 04/12/2021  9:13 AM EDT -----  ----- Message ----- From: Interface, [facility] Lab Results In Sent: 04/12/2021   8:37 AM EDT To: Achd-Results

## 2021-04-17 ENCOUNTER — Ambulatory Visit (LOCAL_COMMUNITY_HEALTH_CENTER): Payer: PRIVATE HEALTH INSURANCE

## 2021-04-17 ENCOUNTER — Other Ambulatory Visit: Payer: Self-pay

## 2021-04-17 DIAGNOSIS — Z23 Encounter for immunization: Secondary | ICD-10-CM | POA: Diagnosis not present

## 2021-04-17 NOTE — Progress Notes (Signed)
Attestation: I agree with the advice given to this patient by our nurse staff.  I have reviewed the RN's note and chart. Documentation reflects my recommendations which were given verbally to the nurse at the point of care.   Lenny Bouchillon Niles Konstantin Lehnen, MD, MPH, ABFM Medical Director  [facility]  

## 2021-04-17 NOTE — Progress Notes (Signed)
Presents for third adult Hepatitis B vaccine as required by [employer] program. Per Hettie Holstein, client previously received HBV 04/17/2015 and 05/18/2015. Although has previously attended a 4 year college, client does not remember receiving 3rd hepatitis B vaccine and unable to locate prior records (other than in [place]). 04/11/2021 client had hepatitis B quantitative titer at [facility] which indicated immunity as result > 1000.0. However, his DPT program is requiring 3 vaccines with titer. Client received copy of 04/11/2021 titer results today per [facility] policy after ROI signed.  Dr. Alvester Morin called and consulted regarding above. Per her telephone verbal order, may administer Adult Hepatitis B vaccine IM x1 (1 mL)  today to complete 3 dose series as required by his academic program. MD order competed and client tolerated without complaint. Jossie Ng, RN

## 2021-04-17 NOTE — Progress Notes (Signed)
Attestation of Medical Director for clinical support staff: I agree with the care provided to this patient and was available for consultation.  I was consulted at the point of care and documentation reflects my recommendations.   Monick Rena Niles Oakleigh Hesketh, MD, MPH, ABFM [facility] Medical Director  

## 2021-05-06 ENCOUNTER — Other Ambulatory Visit: Payer: Self-pay | Admitting: Urology

## 2021-05-06 MED ORDER — TADALAFIL 20 MG PO TABS: ORAL_TABLET | ORAL | 0 refills | Status: DC

## 2021-05-15 ENCOUNTER — Other Ambulatory Visit: Payer: Self-pay

## 2021-05-15 ENCOUNTER — Ambulatory Visit: Payer: PRIVATE HEALTH INSURANCE | Admitting: Physician Assistant

## 2021-05-15 ENCOUNTER — Encounter: Payer: Self-pay | Admitting: Physician Assistant

## 2021-05-15 DIAGNOSIS — Z113 Encounter for screening for infections with a predominantly sexual mode of transmission: Secondary | ICD-10-CM

## 2021-05-15 LAB — GRAM STAIN

## 2021-05-15 LAB — HM HIV SCREENING LAB: HM HIV Screening: NEGATIVE

## 2021-05-15 NOTE — Progress Notes (Signed)
Pt here for STD screening.  Gram Stain results reviewed, no treatment required.  Condoms given. Berdie Ogren, RN

## 2021-05-15 NOTE — Progress Notes (Signed)
   [facility] Department STI clinic/screening visit  Subjective:  Carlos Stewart is a 35 y.o. male being seen today for an STI screening visit. The patient reports they do not have symptoms.    Patient has the following medical conditions:   Patient Active Problem List   Diagnosis Date Noted   Post concussion syndrome 07/28/2019     Chief Complaint  Patient presents with   SEXUALLY TRANSMITTED DISEASE    Screening    HPI  Patient reports that he is not having any symptoms but would like a screening today.  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  States last HIV test was about 1 year ago and last void prior to sample collection for Gram stain was about 2 hr ago.   See flowsheet for further details and programmatic requirements.    The following portions of the patient's history were reviewed and updated as appropriate: allergies, current medications, past medical history, past social history, past surgical history and problem list.  Objective:  There were no vitals filed for this visit.  Physical Exam Constitutional:      General: He is not in acute distress.    Appearance: Normal appearance.  HENT:     Head: Normocephalic and atraumatic.     Comments: No nits,lice, or hair loss. No cervical, supraclavicular or axillary adenopathy.     Mouth/Throat:     Mouth: Mucous membranes are moist.     Pharynx: Oropharynx is clear. No oropharyngeal exudate or posterior oropharyngeal erythema.  Eyes:     Conjunctiva/sclera: Conjunctivae normal.  Pulmonary:     Effort: Pulmonary effort is normal.  Abdominal:     Palpations: Abdomen is soft. There is no mass.     Tenderness: There is no abdominal tenderness. There is no guarding or rebound.  Genitourinary:    Penis: Normal.      Testes: Normal.     Comments: Pubic area without nits, lice, hair loss, edema, erythema, lesions and inguinal  adenopathy. Penis circumcised without rash, lesions and discharge at meatus. Testicles descended bilaterally,nt, no masses or edema.  Musculoskeletal:     Cervical back: Neck supple. No tenderness.  Skin:    General: Skin is warm and dry.     Findings: No bruising, erythema, lesion or rash.  Neurological:     Mental Status: He is alert and oriented to person, place, and time.  Psychiatric:        Mood and Affect: Mood normal.        Behavior: Behavior normal.        Thought Content: Thought content normal.        Judgment: Judgment normal.      Assessment and Plan:  Carlos Stewart is a 35 y.o. male presenting to the [facility] Department for STI screening  1. Screening for STD (sexually transmitted disease) Patient into clinic without symptoms. Rec condoms with all sex. Await test results.  Counseled that RN will call if needs to RTC for treatment once results are back.  - Gram stain - Gonococcus culture - HIV [facility] - Syphilis Serology, [facility]     No follow-ups on file.  No future appointments.  Matt Holmes, PA

## 2021-05-17 ENCOUNTER — Other Ambulatory Visit: Payer: Self-pay

## 2021-05-17 ENCOUNTER — Ambulatory Visit (INDEPENDENT_AMBULATORY_CARE_PROVIDER_SITE_OTHER): Payer: PRIVATE HEALTH INSURANCE | Admitting: Urology

## 2021-05-17 ENCOUNTER — Encounter: Payer: Self-pay | Admitting: Urology

## 2021-05-17 VITALS — BP 118/84 | HR 82 | Ht 73.0 in | Wt 190.0 lb

## 2021-05-17 DIAGNOSIS — N528 Other male erectile dysfunction: Secondary | ICD-10-CM

## 2021-05-17 DIAGNOSIS — F5232 Male orgasmic disorder: Secondary | ICD-10-CM | POA: Diagnosis not present

## 2021-05-17 MED ORDER — SILDENAFIL CITRATE 100 MG PO TABS: ORAL_TABLET | ORAL | 0 refills | Status: DC

## 2021-05-17 NOTE — Progress Notes (Signed)
   05/17/2021 8:16 AM   Carlos Stewart 1986/05/26 [id number]  Referring provider: Noralee Stain, MD [address] [place],  [place] [address]  Chief Complaint  Patient presents with   Follow-up     HPI: 35 y.o. male presents for follow-up of ED  Seen September 2021 with ED felt secondary to medication side effects Given trial tadalafil 20 mg He has had variable results with tadalafil His most bothersome symptom now is difficulty maintaining erection more so due to difficulty in achieving ejaculation On nortriptyline 50 mg daily   PMH: No past medical history on file.  Surgical History: No past surgical history on file.  Home Medications:  Allergies as of 05/17/2021       Reactions   Justicia Adhatoda (malabar Nut Tree) [justicia Adhatoda] Anaphylaxis   Ceclor [cefaclor] Other (See Comments)   Pt had reaction as a child, extreme sweating, hyperactivity   Penicillins Hives, Itching   Edema        Medication List        Accurate as of May 17, 2021  8:16 AM. If you have any questions, ask your nurse or doctor.          Adderall XR 20 MG 24 hr capsule Generic drug: amphetamine-dextroamphetamine Take 20 mg by mouth every morning.   albuterol 108 (90 Base) MCG/ACT inhaler Commonly known as: VENTOLIN HFA INHALE 2 PUFFS BY MOUTH EVERY 4 TO 6 HOURS AS NEEDED FOR COUGH FOR SHORTNESS OF BREATH OR CHEST TIGHTNESS   amantadine 100 MG capsule Commonly known as: SYMMETREL Take 100 mg by mouth 2 (two) times daily.   clonazePAM 0.5 MG tablet Commonly known as: KLONOPIN Take 0.5 mg by mouth 3 (three) times daily.   nortriptyline 50 MG capsule Commonly known as: PAMELOR Take 50 mg by mouth at bedtime.   rosuvastatin 40 MG tablet Commonly known as: CRESTOR Take 40 mg by mouth daily.   tadalafil 20 MG tablet Commonly known as: CIALIS Take one tablet by mouth one hour prior to inrercourse        Allergies:  Allergies  Allergen Reactions   Justicia Adhatoda  (Malabar Nut Tree) [Justicia Adhatoda] Anaphylaxis   Ceclor [Cefaclor] Other (See Comments)    Pt had reaction as a child, extreme sweating, hyperactivity   Penicillins Hives and Itching    Edema    Family History: No family history on file.  Social History:  reports that he has never smoked. He has never used smokeless tobacco. He reports current alcohol use. He reports current drug use. Drug: Marijuana.   Physical Exam: There were no vitals taken for this visit.  Constitutional:  Alert and oriented, No acute distress. HEENT: [place] AT, moist mucus membranes.  Trachea midline, no masses. Cardiovascular: No clubbing, cyanosis, or edema. Respiratory: Normal respiratory effort, no increased work of breathing.   Assessment & Plan:    1.  Erectile dysfunction Variable results with tadalafil and he did want to try sildenafil-Rx sent Second line options were discussed including intracavernosal injections and he was given literature  2.  Delayed ejaculation Most likely secondary to nortriptyline   Riki Altes, MD  [facility] [address] [place], [place] [address] [phone]

## 2021-05-20 LAB — GONOCOCCUS CULTURE

## 2021-06-05 ENCOUNTER — Ambulatory Visit
Admission: RE | Admit: 2021-06-05 | Discharge: 2021-06-05 | Disposition: A | Discharge status: Home / Self Care | Payer: PRIVATE HEALTH INSURANCE | Attending: Family Medicine | Admitting: Family Medicine

## 2021-06-05 ENCOUNTER — Ambulatory Visit
Admission: RE | Admit: 2021-06-05 | Discharge: 2021-06-05 | Disposition: A | Discharge status: Home / Self Care | Payer: PRIVATE HEALTH INSURANCE | Source: Ambulatory Visit | Attending: Family Medicine | Admitting: Family Medicine

## 2021-06-05 ENCOUNTER — Other Ambulatory Visit: Payer: Self-pay | Admitting: Family Medicine

## 2021-06-05 DIAGNOSIS — R52 Pain, unspecified: Secondary | ICD-10-CM

## 2021-06-20 ENCOUNTER — Other Ambulatory Visit: Payer: Self-pay | Admitting: Urology

## 2021-06-20 MED ORDER — SILDENAFIL CITRATE 100 MG PO TABS: ORAL_TABLET | ORAL | 0 refills | Status: DC

## 2021-07-02 ENCOUNTER — Other Ambulatory Visit: Payer: Self-pay | Admitting: Urology

## 2021-07-23 IMAGING — XA DG INJECT/[PERSON_NAME] INC NEEDLE/CATH/PLC EPI/CERV/THOR W/IMG
2 series · 2 of 2 positions shown · non-contrast
Comparison: none

CLINICAL DATA: Cervical spondylosis without myelopathy. Right
greater than left neck, upper back, and upper extremity pain.

[Series 1: ortho standard · 1 of 1 slices shown (1 of 2)]
[im 1/1]
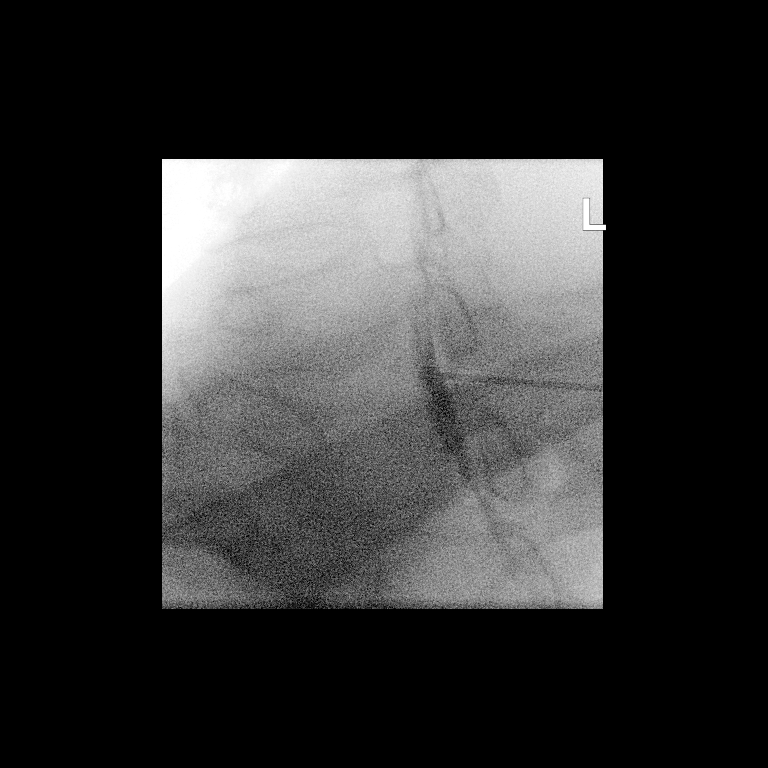

[Series 2: ortho standard · 1 of 1 slices shown (2 of 2)]
[im 1/1]
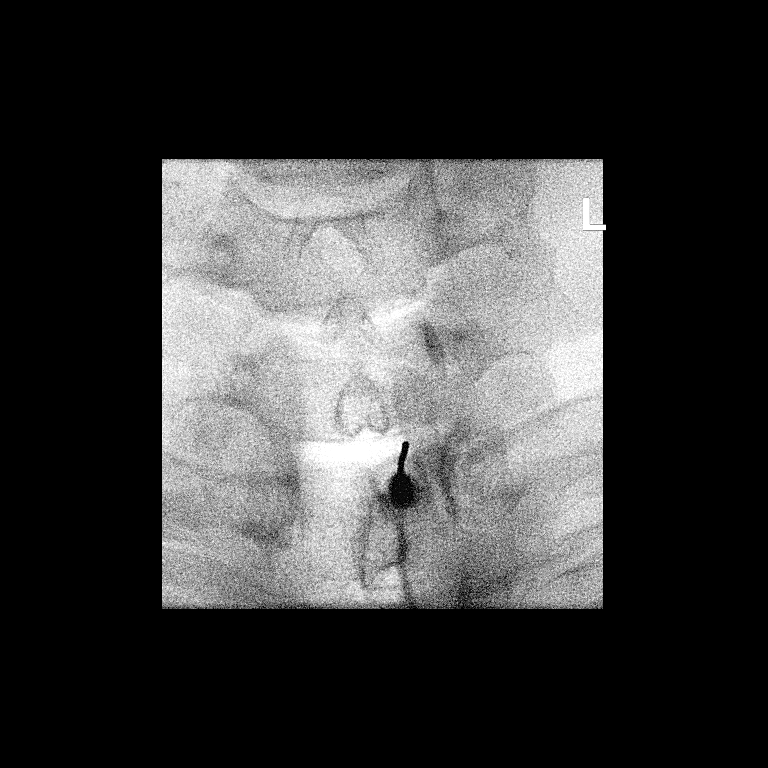

[2 of 2 positions shown; findings below may reference images not displayed]

FLUOROSCOPY TIME:  Fluoroscopy Time: 14 seconds

Radiation Exposure Index: 8.98 microGray*m^2

PROCEDURE:
The procedure, risks, benefits, and alternatives were explained to
the patient. Questions regarding the procedure were encouraged and
answered. The patient understands and consents to the procedure.

CERVICAL EPIDURAL INJECTION

An interlaminar approach was performed on the right at C7-T1. A
inch 20 gauge epidural needle was advanced using loss-of-resistance
technique.

DIAGNOSTIC EPIDURAL INJECTION

Injection of Isovue-M 300 shows a good epidural pattern with spread
above and below the level of needle placement, primarily on the
right. No vascular opacification is seen.

THERAPEUTIC EPIDURAL INJECTION

1.5 ml of Kenalog 40 mixed with 2 ml of normal saline were then
instilled. The procedure was well-tolerated, and the patient was
discharged thirty minutes following the injection in good condition.
IMPRESSION: Technically successful interlaminar epidural injection on the right
at C7-T1.

## 2021-09-04 IMAGING — MR MR HEAD W/ CM
3 series · 48 of 48 positions shown · IV contrast (9ml Gadavist)
Comparison: Noncontrast brain MRI 09/25/2019

CLINICAL DATA: Double vision.  Abnormal brain MRI.

EXAM:
MRI HEAD WITH CONTRAST
TECHNIQUE: Multiplanar, multiecho pulse sequences of the brain and surrounding
structures were obtained with intravenous contrast.
CONTRAST:  9mL GADAVIST GADOBUTROL 1 MMOL/ML IV SOLN

[Series 5: T1 post-contrast · axial · 1.0mm · 0.98mm/px · z∈[-90,+82]mm · 36 of 174 slices shown (1 of 3)]
[im 1/174]
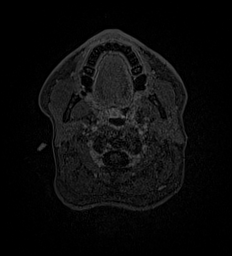
[im 5/174]
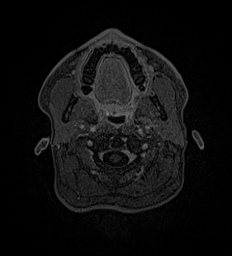
[im 10/174]
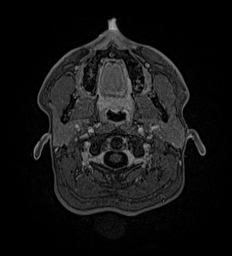
[im 15/174]
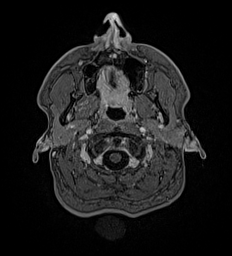
[im 20/174]
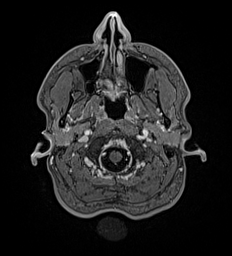
[im 25/174]
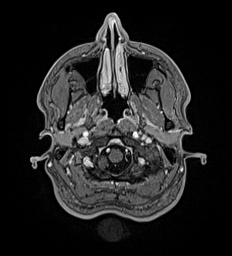
[im 30/174]
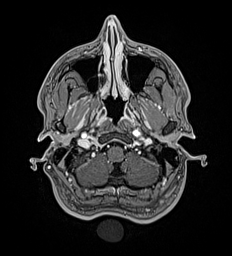
[im 35/174]
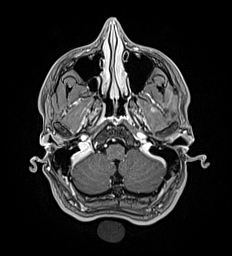
[im 40/174]
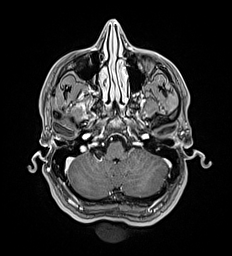
[im 45/174]
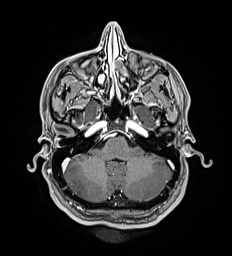
[im 50/174]
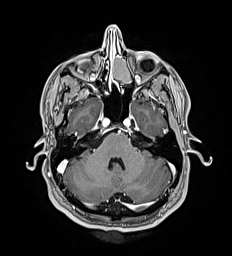
[im 55/174]
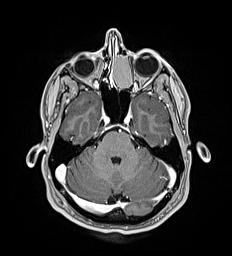
[im 60/174]
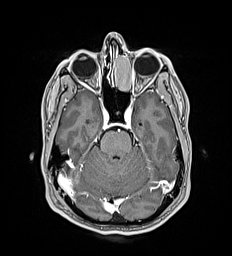
[im 65/174]
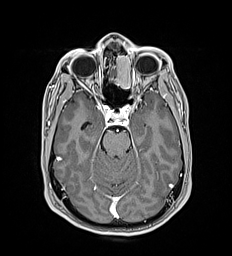
[im 70/174]
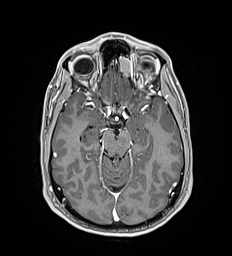
[im 75/174]
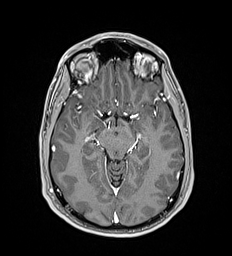
[im 80/174]
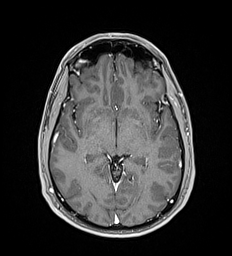
[im 85/174]
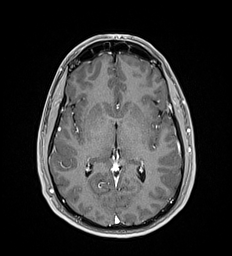
[im 89/174]
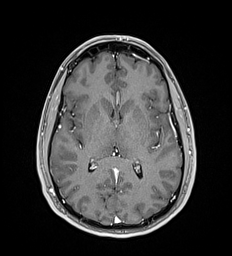
[im 94/174]
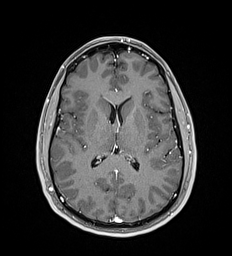
[im 99/174]
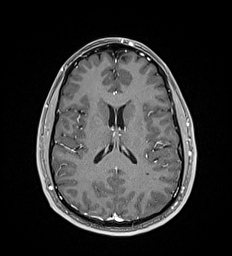
[im 104/174]
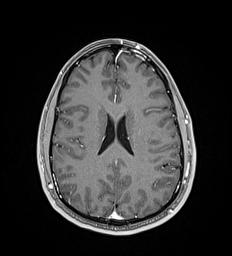
[im 109/174]
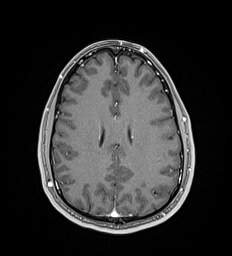
[im 114/174]
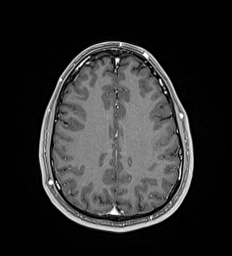
[im 119/174]
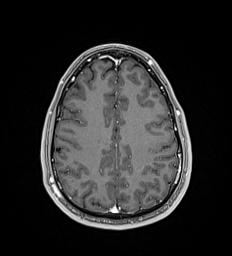
[im 124/174]
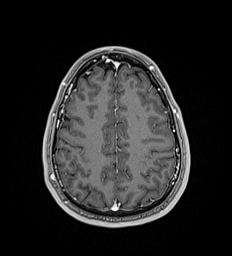
[im 129/174]
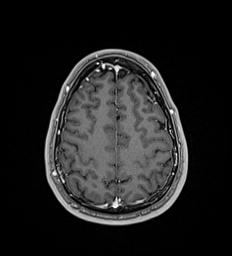
[im 134/174]
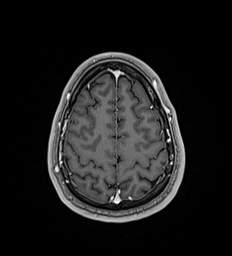
[im 139/174]
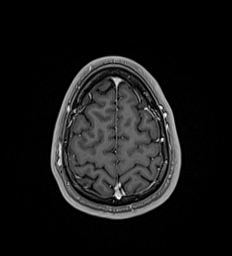
[im 144/174]
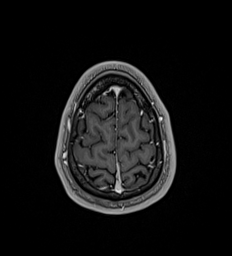
[im 149/174]
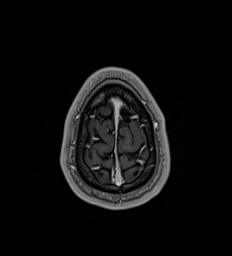
[im 154/174]
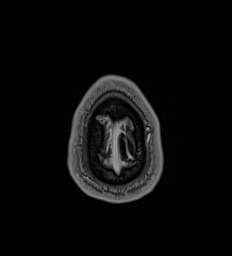
[im 159/174]
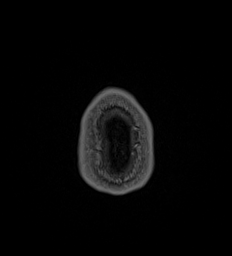
[im 164/174]
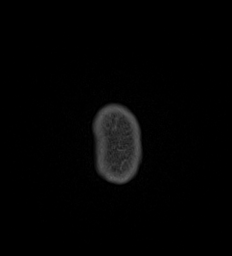
[im 169/174]
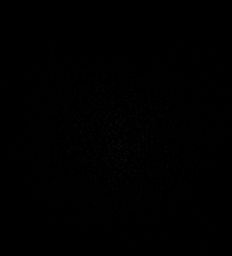
[im 174/174]
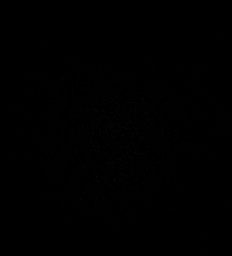

[Series 6: T1 post-contrast · coronal · 5.0mm · 0.57mm/px · 7 of 33 slices shown (2 of 3)]
[im 1/33]
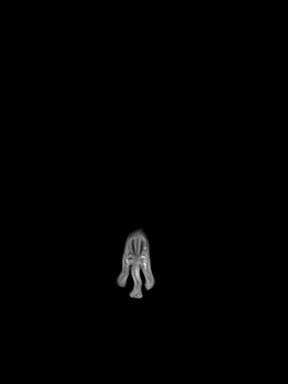
[im 6/33]
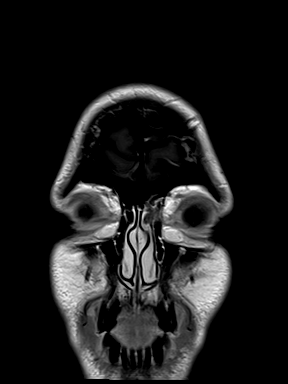
[im 11/33]
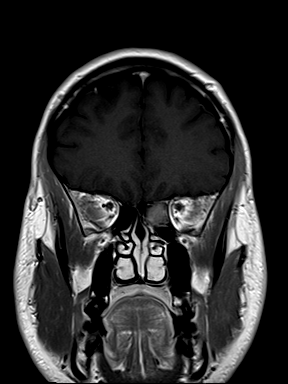
[im 17/33]
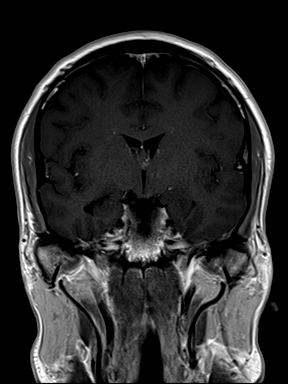
[im 22/33]
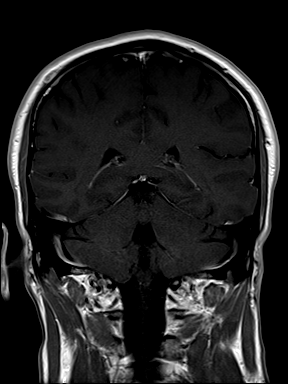
[im 27/33]
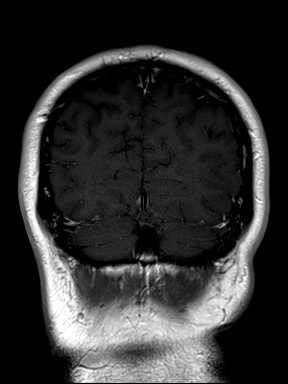
[im 33/33]
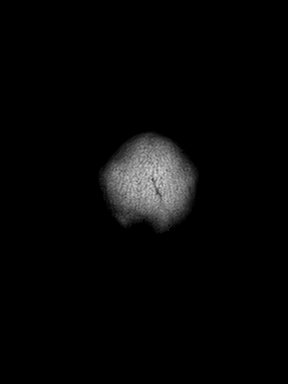

[Series 7: T1 post-contrast · sagittal · 5.0mm · 0.62mm/px · 5 of 24 slices shown (3 of 3)]
[im 1/24]
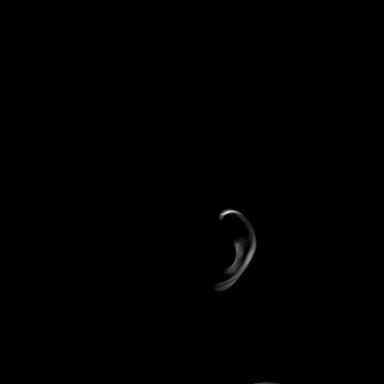
[im 6/24]
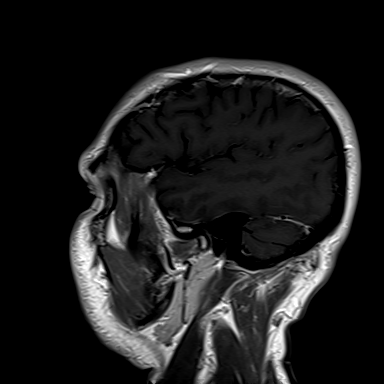
[im 12/24]
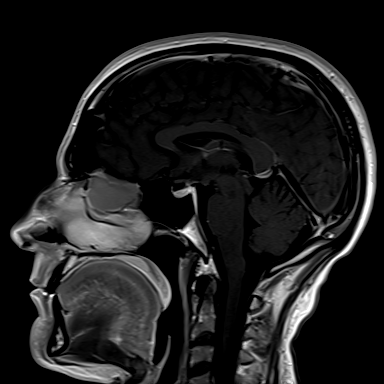
[im 18/24]
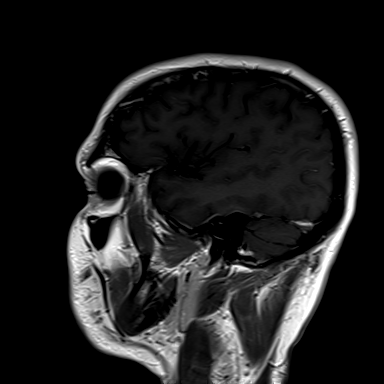
[im 24/24]
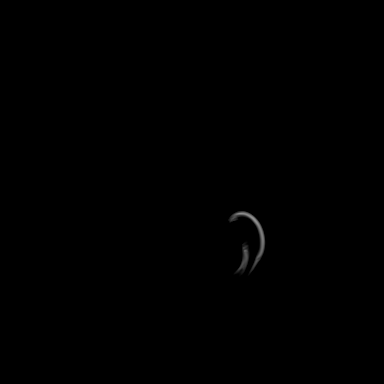

[48 of 48 positions shown; findings below may reference images not displayed]

FINDINGS: Brain:

Only axial, coronal and sagittal postcontrast T1 weighted imaging
was performed as a follow-up to the noncontrast brain MRI performed
09/25/2019.

No abnormal enhancement is demonstrated within the brain parenchyma
or intracranial compartment.

Vascular: Expected enhancement within the proximal large arterial
vessels and dural venous sinuses.

Skull and upper cervical spine: No enhancing marrow lesion is
identified.

Sinuses/Orbits: Visualized orbits demonstrate no acute abnormality.
Unchanged size of a 3.5 x 1.7 x 3.0 cm well-circumscribed lobular
and expansile focus centered within the left ethmoid air cells.
Precontrast T1 hyperintensity on prior examination 09/25/2019. No
nodular enhancing components or internal complexity demonstrated on
today's examination.
IMPRESSION: 3.5 x 1.7 x 3.0 cm well-circumscribed lobular and expansile focus
centered within the left ethmoid air cells, unchanged in size from
MRI 09/25/2019. No nodular enhancing components or internal
complexity demonstrated on today's examination. Findings remain most
consistent with mucocele.

No abnormal enhancement within the intracranial compartment.

## 2021-09-24 ENCOUNTER — Other Ambulatory Visit: Payer: Self-pay | Admitting: Urology

## 2021-09-24 MED ORDER — SILDENAFIL CITRATE 100 MG PO TABS: ORAL_TABLET | ORAL | 0 refills | Status: DC

## 2022-01-07 ENCOUNTER — Other Ambulatory Visit: Payer: Self-pay | Admitting: Urology

## 2022-01-07 MED ORDER — SILDENAFIL CITRATE 100 MG PO TABS: ORAL_TABLET | ORAL | 0 refills | Status: DC

## 2022-01-29 DIAGNOSIS — R4189 Other symptoms and signs involving cognitive functions and awareness: Secondary | ICD-10-CM | POA: Diagnosis not present

## 2022-01-29 DIAGNOSIS — G40909 Epilepsy, unspecified, not intractable, without status epilepticus: Secondary | ICD-10-CM | POA: Diagnosis not present

## 2022-01-29 DIAGNOSIS — G4701 Insomnia due to medical condition: Secondary | ICD-10-CM | POA: Diagnosis not present

## 2022-02-24 DIAGNOSIS — Z0000 Encounter for general adult medical examination without abnormal findings: Secondary | ICD-10-CM | POA: Diagnosis not present

## 2022-02-24 DIAGNOSIS — Z8249 Family history of ischemic heart disease and other diseases of the circulatory system: Secondary | ICD-10-CM | POA: Diagnosis not present

## 2022-03-26 ENCOUNTER — Encounter: Payer: Self-pay | Admitting: Adult Health

## 2022-03-26 ENCOUNTER — Other Ambulatory Visit: Payer: 59 | Admitting: Adult Health

## 2022-03-26 VITALS — BP 122/78 | HR 73 | Temp 97.2°F | Ht 71.0 in | Wt 205.6 lb

## 2022-03-26 DIAGNOSIS — Z111 Encounter for screening for respiratory tuberculosis: Secondary | ICD-10-CM

## 2022-03-26 NOTE — Progress Notes (Signed)
[facility] [address] [place], [place] [address] Phone: [phone] Fax: [phone]   Office Visit Note  Patient Name: Carlos Stewart  Date of [id number]  Med Rec number [id number]  Date of Service: 03/26/2022  Bevelyn Buckles (malabar nut tree) [justicia adhatoda], Ceclor [cefaclor], and Penicillins  Chief Complaint  Patient presents with  . Labs Only     HPI  Patient is DPT student, here for TB Quant for clinicals. Denies any other need.  No history of TB.  Never tested positive before, and was born here in the [place].   Current Medication:  Outpatient Encounter Medications as of 03/26/2022  Medication Sig  . ADDERALL XR 20 MG 24 hr capsule Take 20 mg by mouth every morning.  Marland Kitchen albuterol (VENTOLIN HFA) 108 (90 Base) MCG/ACT inhaler INHALE 2 PUFFS BY MOUTH EVERY 4 TO 6 HOURS AS NEEDED FOR COUGH FOR SHORTNESS OF BREATH OR CHEST TIGHTNESS  . rosuvastatin (CRESTOR) 40 MG tablet Take 40 mg by mouth daily.  . sildenafil (VIAGRA) 100 MG tablet 1 tab 1 hour prior to intercourse  . SUMAtriptan (IMITREX) 50 MG tablet Take by mouth.  . [DISCONTINUED] amantadine (SYMMETREL) 100 MG capsule Take 100 mg by mouth 2 (two) times daily.  . [DISCONTINUED] clonazePAM (KLONOPIN) 0.5 MG tablet Take 0.5 mg by mouth 3 (three) times daily.  . [DISCONTINUED] nortriptyline (PAMELOR) 25 MG capsule Take 25 mg by mouth at bedtime.   No facility-administered encounter medications on file as of 03/26/2022.      Medical History: No past medical history on file.   Vital Signs: BP 122/78   Pulse 73   Temp (!) 97.2 F (36.2 C) (Tympanic)   Ht 5\' 11"  (1.803 m)   Wt 205 lb 9.6 oz (93.3 kg)   SpO2 98%   BMI 28.68 kg/m    Review of Systems  Constitutional: Negative.     Physical Exam Vitals and nursing note reviewed.   Assessment/Plan: 1. Screening examination for pulmonary tuberculosis Follow up in mychart for results.  - QuantiFERON-TB Gold Plus     General  Counseling: Harding verbalizes understanding of the findings of todays visit and agrees with plan of treatment. I have discussed any further diagnostic evaluation that may be needed or ordered today. We also reviewed his medications today. he has been encouraged to call the office with any questions or concerns that should arise related to todays visit.   Orders Placed This Encounter  Procedures  . QuantiFERON-TB Gold Plus    No orders of the defined types were placed in this encounter.   Time spent:10 Minutes    AGNP-C Nurse Practitioner

## 2022-03-29 LAB — QUANTIFERON-TB GOLD PLUS
QuantiFERON Mitogen Value: >10 IU/mL
QuantiFERON Nil Value: 0.05 IU/mL
QuantiFERON TB1 Ag Value: 0.8 IU/mL
QuantiFERON TB2 Ag Value: 0.81 IU/mL
QuantiFERON-TB Gold Plus: POSITIVE — AB

## 2022-04-02 ENCOUNTER — Other Ambulatory Visit: Payer: Self-pay | Admitting: Urology

## 2022-04-02 DIAGNOSIS — Z111 Encounter for screening for respiratory tuberculosis: Secondary | ICD-10-CM | POA: Diagnosis not present

## 2022-04-02 MED ORDER — SILDENAFIL CITRATE 100 MG PO TABS: ORAL_TABLET | ORAL | 0 refills | Status: DC

## 2022-04-02 NOTE — Progress Notes (Signed)
Spoke with Carlos Stewart on the phone.  Unable to provide PPD here at clinic.  Patient instructed to go to [facility], health dept, or urgent care for TB skin test. (PPD).

## 2022-04-04 ENCOUNTER — Encounter: Payer: Self-pay | Admitting: Adult Health

## 2022-04-04 DIAGNOSIS — Z111 Encounter for screening for respiratory tuberculosis: Secondary | ICD-10-CM | POA: Diagnosis not present

## 2022-04-09 ENCOUNTER — Encounter: Payer: Self-pay | Admitting: Adult Health

## 2022-04-09 NOTE — Telephone Encounter (Signed)
Patient had negative PPD after a positive Quant.  No further actions at this time.    Johnna Acosta DNP, NP-C Nurse Practitioner [facility]

## 2022-04-16 DIAGNOSIS — Z111 Encounter for screening for respiratory tuberculosis: Secondary | ICD-10-CM | POA: Diagnosis not present

## 2022-04-18 DIAGNOSIS — Z111 Encounter for screening for respiratory tuberculosis: Secondary | ICD-10-CM | POA: Diagnosis not present

## 2022-05-07 DIAGNOSIS — G43009 Migraine without aura, not intractable, without status migrainosus: Secondary | ICD-10-CM | POA: Insufficient documentation

## 2022-05-07 DIAGNOSIS — R69 Illness, unspecified: Secondary | ICD-10-CM | POA: Diagnosis not present

## 2022-05-07 DIAGNOSIS — R419 Unspecified symptoms and signs involving cognitive functions and awareness: Secondary | ICD-10-CM | POA: Diagnosis not present

## 2022-08-06 ENCOUNTER — Other Ambulatory Visit: Payer: Self-pay | Admitting: Urology

## 2022-08-21 ENCOUNTER — Other Ambulatory Visit: Payer: Self-pay | Admitting: Urology

## 2022-08-22 ENCOUNTER — Other Ambulatory Visit: Payer: Self-pay | Admitting: Urology

## 2022-08-28 ENCOUNTER — Ambulatory Visit: Payer: Self-pay | Admitting: Physician Assistant

## 2022-08-28 DIAGNOSIS — Z113 Encounter for screening for infections with a predominantly sexual mode of transmission: Secondary | ICD-10-CM

## 2022-08-28 LAB — HM HEPATITIS C SCREENING LAB: HM Hepatitis Screen: NEGATIVE

## 2022-08-28 LAB — HM HIV SCREENING LAB: HM HIV Screening: NEGATIVE

## 2022-08-28 NOTE — Progress Notes (Signed)
[facility] Department STI clinic/screening visit  Subjective:  Carlos Stewart is a 36 y.o. male being seen today for an STI screening visit. The patient reports they do not have symptoms.    Patient has the following medical conditions:   Patient Active Problem List   Diagnosis Date Noted   Post concussion syndrome 07/28/2019     Chief Complaint  Patient presents with   SEXUALLY TRANSMITTED DISEASE    Man presents for STI screen. In new relationship. No symptoms.    Patient reports no sex in 3 mo.  Last HIV test per patient/review of record was  Lab Results  Component Value Date   HMHIVSCREEN Negative - Validated 05/15/2021   No results found for: "HIV"  Does the patient or their partner desires a pregnancy in the next year? No  Screening for MPX risk: Does the patient have an unexplained rash? No Is the patient MSM? No Does the patient endorse multiple sex partners or anonymous sex partners? No Did the patient have close or sexual contact with a person diagnosed with MPX? No Has the patient traveled outside the [place] where MPX is endemic? No Is there a high clinical suspicion for MPX-- evidenced by one of the following No  -Unlikely to be chickenpox  -Lymphadenopathy  -Rash that present in same phase of evolution on any given body part   See flowsheet for further details and programmatic requirements.   Immunization History  Administered Date(s) Administered   Hepatitis B 04/17/2021   Influenza,inj,Quad PF,6+ Mos 07/25/2019   Influenza-Unspecified 07/25/2019, 04/02/2021   PFIZER(Purple Top)SARS-COV-2 Vaccination 12/03/2019, 12/28/2019   PPD Test 04/02/2021   Tdap 04/02/2021     The following portions of the patient's history were reviewed and updated as appropriate: allergies, current medications, past medical history, past social history, past surgical history and problem list.  Objective:  There were no vitals filed for this visit.  Physical  Exam Constitutional:      Appearance: Normal appearance.  Pulmonary:     Effort: Pulmonary effort is normal.  Abdominal:     General: Abdomen is flat.     Tenderness: There is no abdominal tenderness.  Genitourinary:    Pubic Area: No rash.      Penis: Circumcised. No tenderness or discharge.      Testes:        Right: Tenderness or swelling not present.        Left: Tenderness or swelling not present.     Epididymis:     Right: No tenderness.     Left: No tenderness.  Lymphadenopathy:     Lower Body: No right inguinal adenopathy. No left inguinal adenopathy.  Skin:    General: Skin is warm and dry.     Findings: Lesion present. No rash.     Comments: Bandage covering abrasion R knee.  Neurological:     General: No focal deficit present.     Mental Status: Carlos Stewart is alert and oriented to person, place, and time.  Psychiatric:        Behavior: Behavior normal.        Thought Content: Thought content normal.        Judgment: Judgment normal.     Assessment and Plan:  Carlos Stewart is a 36 y.o. male presenting to the [facility] Department for STI screening  1. Routine screening for STI (sexually transmitted infection) Await test results. Recommend condoms with all sex. - HIV/HCV [facility] -  Syphilis Serology, [facility] - Chlamydia/GC NAA, Confirmation   Patient does not have STI symptoms Patient accepted all screenings including   Patient meets criteria for HepB screening? No. Ordered? no Patient meets criteria for HepC screening? Yes. Ordered? yes Recommended condom use with all sex Discussed importance of condom use for STI prevent  Treat gram stain per standing order Discussed time line for [facility] results and that patient will be called with positive results and encouraged patient to call if Carlos Stewart had not heard in 2 weeks Recommended returning for continued or worsening symptoms.   Return in about 6 months (around 02/26/2023) for STI  screening.  Future Appointments  Date Time Provider Department Center  09/17/2022 10:45 AM Stoioff, Verna Czech, MD [facility] None    Landry Dyke, PA-C

## 2022-09-01 LAB — CHLAMYDIA/GC NAA, CONFIRMATION
Chlamydia trachomatis, NAA: NEGATIVE
Neisseria gonorrhoeae, NAA: NEGATIVE

## 2022-09-12 ENCOUNTER — Encounter: Payer: Self-pay | Admitting: Medical

## 2022-09-12 ENCOUNTER — Other Ambulatory Visit: Payer: Self-pay

## 2022-09-12 ENCOUNTER — Ambulatory Visit (INDEPENDENT_AMBULATORY_CARE_PROVIDER_SITE_OTHER): Payer: 59 | Admitting: Medical

## 2022-09-12 VITALS — BP 101/64 | HR 97 | Temp 97.3°F | Ht 70.87 in | Wt 205.0 lb

## 2022-09-12 DIAGNOSIS — F419 Anxiety disorder, unspecified: Secondary | ICD-10-CM

## 2022-09-12 DIAGNOSIS — F0781 Postconcussional syndrome: Secondary | ICD-10-CM

## 2022-09-12 DIAGNOSIS — R419 Unspecified symptoms and signs involving cognitive functions and awareness: Secondary | ICD-10-CM | POA: Diagnosis not present

## 2022-09-12 NOTE — Progress Notes (Unsigned)
[facility] [address] [place], [place] [address] Phone: [phone] Fax: [phone]   Office Visit Note  Patient Name: Carlos Stewart  Date of [id number]  Med Rec number [id number]  Date of Service: 09/15/2022  Allergies: Justicia adhatoda (malabar nut tree) [justicia adhatoda], Ceclor [cefaclor], and Penicillins  Chief Complaint  Patient presents with   Referral     HPI 36 YO male requests referral to occupational therapy.  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 Seeking OT referral for assistance with difficulty finding words, logical progression of tasks and overfocusing on certain tasks.   Seen by an OT previously, Carlos Stewart, [facility]. Most recently saw them summer 2022 on recommendation by neurologist. Saw OT only twice for an "evaluation", was discharged from therapy but states he was told there were things OT could help with if they worsened. Review of OT note from 03/07/21 states, "The patient will benefit from skilled OT intervention to address the above stated impairments and assist the patient in maximizing their functional level".  He states he feels like sx have worsened recently. Had difficulty completing practical exams/standardized patients 2 months ago, performance was not satisfactory. Patient admits components of anxiety and cognitive difficulty contribute to poor outcome. Has to give a presentation on 09/25/22 to DPT faculty who will vote to decide if he can continue in program. If allowed to proceed, will not be able to retake required course until summer.  Has been seen previously by [facility] counseling. He states he was told that they could not meet his needs. Has appt with [facility] for teletherapy next week, intake appt. Has been using [facility] recently. He states he was referred to local therapists by [facility] and [place]  counseling but had difficulty scheduling appointment (states messages were not returned).   States he isn't depressed "yet" and but is anxious. Would rather not take medication for his anxiety.   Current Medication:  Outpatient Encounter Medications as of 09/12/2022  Medication Sig   ADDERALL XR 20 MG 24 hr capsule Take 20 mg by mouth every morning.   albuterol (VENTOLIN HFA) 108 (90 Base) MCG/ACT inhaler INHALE 2 PUFFS BY MOUTH EVERY 4 TO 6 HOURS AS NEEDED FOR COUGH FOR SHORTNESS OF BREATH OR CHEST TIGHTNESS   amphetamine-dextroamphetamine (ADDERALL) 20 MG tablet Take 20 mg by mouth daily.   rosuvastatin (CRESTOR) 40 MG tablet Take 40 mg by mouth daily.   sildenafil (VIAGRA) 100 MG tablet 1 tab 1 hour prior to intercourse   SUMAtriptan (IMITREX) 50 MG tablet Take by mouth.   No facility-administered encounter medications on file as of 09/12/2022.      Medical History: Past Medical History:  Diagnosis Date   ADHD (attention deficit hyperactivity disorder)    Migraine headache without aura    Post concussion syndrome      Vital Signs: BP 101/64   Pulse 97   Temp (!) 97.3 F (36.3 C) (Tympanic)   Ht 5' 10.87" (1.8 m)   Wt 205 lb (93 kg)   SpO2 98%   BMI 28.70 kg/m    Review of Systems  Constitutional: Negative.   Psychiatric/Behavioral:  Positive for decreased concentration. The patient is nervous/anxious.     Physical Exam Vitals reviewed.  Constitutional:      General: He is not in acute distress.    Appearance: He is not ill-appearing.  Neurological:  Mental Status: He is alert and oriented to person, place, and time.     Gait: Gait is intact.  Psychiatric:        Attention and Perception: Attention normal.        Mood and Affect: Mood normal. Affect is flat.        Speech: Speech normal.        Behavior: Behavior is cooperative.        Thought Content: Thought content normal. Thought content does not include suicidal ideation. Thought content does not  include suicidal plan.     Assessment/Plan: 1. Cognitive complaints 2. Post concussion syndrome Will make referral as requested to [facility] in [facility] (preferred by patient). Patient plans to contact practice to schedule appointment. - Ambulatory referral to Occupational Therapy  3. Anxiety Keep appointment with [facility] next week. May continue to use [facility] as needed in meantime. Raised suggestion that patient might benefit from medication to help treat his anxiety. Gave contact info for [facility] if he would like to consider medication, especially if this is recommended by therapist.   Patient encouraged to follow up (send message or schedule follow up visit) as needed.   General Counseling: [name] verbalizes understanding of the findings of todays visit and agrees with plan of treatment. he has been encouraged to call the office with any questions or concerns that should arise related to todays visit.   Orders Placed This Encounter  Procedures   Ambulatory referral to Occupational Therapy    No orders of the defined types were placed in this encounter.   Time spent:25 Minutes    Jonathon Resides PA-C [facility] 09/15/2022 10:30 PM

## 2022-09-15 ENCOUNTER — Encounter: Payer: Self-pay | Admitting: Medical

## 2022-09-15 NOTE — Patient Instructions (Signed)
We will fax your referral to [facility] in [facility]. You may call them to schedule an appointment.  Keep appointment with [facility] next week. May continue to use [facility] as needed in meantime. Medication might be a helpful tool to help you manage your anxiety.  If you would like to consider medication or it is recommended by your therapist, consider calling [facility]. 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  -Send MyChart message to provider or schedule return visit as needed.

## 2022-09-17 ENCOUNTER — Encounter: Payer: Self-pay | Admitting: Urology

## 2022-09-17 ENCOUNTER — Ambulatory Visit: Payer: 59 | Admitting: Urology

## 2022-09-17 VITALS — BP 119/83 | HR 99 | Ht 72.0 in | Wt 204.0 lb

## 2022-09-17 DIAGNOSIS — N528 Other male erectile dysfunction: Secondary | ICD-10-CM | POA: Diagnosis not present

## 2022-09-17 MED ORDER — SILDENAFIL CITRATE 100 MG PO TABS: ORAL_TABLET | ORAL | 2 refills | Status: DC

## 2022-09-17 NOTE — Progress Notes (Signed)
   09/17/2022 10:48 AM   Carlos Stewart 1986-02-17 [id number]  Referring provider: No referring provider defined for this encounter.  Chief Complaint  Patient presents with   Erectile Dysfunction     HPI: 36 y.o. male presents for follow-up of ED  Seen September 2021 with ED felt secondary to medication side effects Given trial tadalafil 20 mg He has had variable results with tadalafil His most bothersome symptom now is difficulty maintaining erection more so due to difficulty in achieving ejaculation On nortriptyline 50 mg daily   PMH: Past Medical History:  Diagnosis Date   ADHD (attention deficit hyperactivity disorder)    Migraine headache without aura    Post concussion syndrome     Surgical History: No past surgical history on file.  Home Medications:  Allergies as of 09/17/2022       Reactions   Justicia Adhatoda (malabar Nut Tree) [justicia Adhatoda] Anaphylaxis   Tree Nuts and Coconut   Ceclor [cefaclor] Other (See Comments)   Pt had reaction as a child, extreme sweating, hyperactivity   Penicillins Hives, Itching   Edema        Medication List        Accurate as of September 17, 2022 10:48 AM. If you have any questions, ask your nurse or doctor.          Adderall 20 MG tablet Generic drug: amphetamine-dextroamphetamine Take 20 mg by mouth daily.   Adderall XR 20 MG 24 hr capsule Generic drug: amphetamine-dextroamphetamine Take 20 mg by mouth every morning.   albuterol 108 (90 Base) MCG/ACT inhaler Commonly known as: VENTOLIN HFA INHALE 2 PUFFS BY MOUTH EVERY 4 TO 6 HOURS AS NEEDED FOR COUGH FOR SHORTNESS OF BREATH OR CHEST TIGHTNESS   rosuvastatin 40 MG tablet Commonly known as: CRESTOR Take 40 mg by mouth daily.   sildenafil 100 MG tablet Commonly known as: VIAGRA 1 tab 1 hour prior to intercourse   SUMAtriptan 50 MG tablet Commonly known as: IMITREX Take by mouth.        Allergies:  Allergies  Allergen Reactions    Justicia Adhatoda (Malabar Nut Tree) [Justicia Adhatoda] Anaphylaxis    Tree Nuts and Coconut   Ceclor [Cefaclor] Other (See Comments)    Pt had reaction as a child, extreme sweating, hyperactivity   Penicillins Hives and Itching    Edema    Family History: No family history on file.  Social History:  reports that he has never smoked. He has never used smokeless tobacco. He reports current alcohol use. He reports current drug use. Drug: Marijuana.   Physical Exam: BP 119/83   Pulse 99   Ht 6' (1.829 m)   Wt 204 lb (92.5 kg)   BMI 27.67 kg/m   Constitutional:  Alert and oriented, No acute distress. HEENT: [place] AT, moist mucus membranes.  Trachea midline, no masses. Cardiovascular: No clubbing, cyanosis, or edema. Respiratory: Normal respiratory effort, no increased work of breathing.   Assessment & Plan:    1.  Erectile dysfunction Variable results with tadalafil and he did want to try sildenafil-Rx sent Second line options were discussed including intracavernosal injections and he was given literature  2.  Delayed ejaculation Most likely secondary to nortriptyline   Riki Altes, MD  [facility] [address] [place], [place] [address] [phone]

## 2022-09-18 ENCOUNTER — Encounter: Payer: Self-pay | Admitting: Urology

## 2022-10-14 ENCOUNTER — Ambulatory Visit (INDEPENDENT_AMBULATORY_CARE_PROVIDER_SITE_OTHER): Payer: 59 | Admitting: Psychiatry

## 2022-10-14 ENCOUNTER — Encounter: Payer: Self-pay | Admitting: Psychiatry

## 2022-10-14 VITALS — BP 125/82 | HR 76 | Temp 98.3°F | Ht 73.0 in | Wt 209.0 lb

## 2022-10-14 DIAGNOSIS — F321 Major depressive disorder, single episode, moderate: Secondary | ICD-10-CM | POA: Diagnosis not present

## 2022-10-14 MED ORDER — BUPROPION HCL ER (XL) 150 MG PO TB24: 150.0000 mg | ORAL_TABLET | Freq: Every day | ORAL | 1 refills | Status: DC
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
## 2022-10-14 NOTE — Patient Instructions (Signed)
Start bupropion 150 mg daily  Obtain TSH  Next appointment: 2/15 at 9:30

## 2022-10-16 ENCOUNTER — Encounter: Payer: Self-pay | Admitting: Psychiatry

## 2022-10-16 LAB — TSH: TSH: 2.61 u[IU]/mL (ref 0.450–4.500)

## 2022-10-21 ENCOUNTER — Ambulatory Visit: Payer: Self-pay | Admitting: Psychiatry

## 2022-11-10 ENCOUNTER — Ambulatory Visit: Payer: 59 | Admitting: Psychiatry

## 2022-11-10 ENCOUNTER — Encounter: Payer: Self-pay | Admitting: Psychiatry

## 2022-11-10 VITALS — BP 123/85 | HR 75 | Temp 98.3°F | Ht 73.0 in | Wt 199.2 lb

## 2022-11-10 DIAGNOSIS — F321 Major depressive disorder, single episode, moderate: Secondary | ICD-10-CM

## 2022-11-10 MED ORDER — BUPROPION HCL ER (XL) 300 MG PO TB24: 300.0000 mg | ORAL_TABLET | Freq: Every day | ORAL | 1 refills | Status: DC

## 2022-11-10 NOTE — Patient Instructions (Signed)
Increase bupropion 300 mg daily  Obtain TSH to rule out medical health condition contributing to his symptoms Next appointment: 3/26 at 3:30
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
## 2022-11-27 ENCOUNTER — Ambulatory Visit: Payer: 59 | Admitting: Psychiatry

## 2022-12-31 ENCOUNTER — Encounter: Payer: Self-pay | Admitting: Family Medicine

## 2022-12-31 ENCOUNTER — Ambulatory Visit: Payer: 59 | Admitting: Family Medicine

## 2022-12-31 DIAGNOSIS — Z113 Encounter for screening for infections with a predominantly sexual mode of transmission: Secondary | ICD-10-CM

## 2022-12-31 LAB — HM HIV SCREENING LAB: HM HIV Screening: NEGATIVE

## 2022-12-31 NOTE — Progress Notes (Signed)
[facility] Department STI clinic/screening visit  Subjective:  Carlos Stewart is a 37 y.o. male being seen today for an STI screening visit. The patient reports they do not have symptoms.    Patient has the following medical conditions:   Patient Active Problem List   Diagnosis Date Noted   Migraine without aura and without status migrainosus, not intractable 05/07/2022   Post concussion syndrome 07/28/2019    Chief Complaint  Patient presents with   STD screen    Pt wants STD screening, denies symptoms    HPI  Patient reports to clinic for STI testing.   Last HIV test per patient/review of record was  Lab Results  Component Value Date   HMHIVSCREEN Negative - Validated 08/28/2022   No results found for: "HIV"  Does the patient or their partner desires a pregnancy in the next year? No  Screening for MPX risk: Does the patient have an unexplained rash? No Is the patient MSM? No Does the patient endorse multiple sex partners or anonymous sex partners? No Did the patient have close or sexual contact with a person diagnosed with MPX? No Has the patient traveled outside the [place] where MPX is endemic? No Is there a high clinical suspicion for MPX-- evidenced by one of the following No  -Unlikely to be chickenpox  -Lymphadenopathy  -Rash that present in same phase of evolution on any given body part   See flowsheet for further details and programmatic requirements.   Immunization History  Administered Date(s) Administered   Covid-19, Mrna,Vaccine(Spikevax)53yrs and older 09/19/2022   Hepatitis B 04/17/2021   Influenza,inj,Quad PF,6+ Mos 07/25/2019   Influenza-Unspecified 07/25/2019, 04/02/2021   PFIZER(Purple Top)SARS-COV-2 Vaccination 12/03/2019, 12/28/2019   PPD Test 04/02/2021   Tdap 04/02/2021     The following portions of the patient's history were reviewed and updated as appropriate: allergies, current medications, past medical history, past social  history, past surgical history and problem list.  Objective:  There were no vitals filed for this visit.  Physical Exam Vitals and nursing note reviewed.  Constitutional:      Appearance: Normal appearance.  HENT:     Head: Normocephalic and atraumatic.     Mouth/Throat:     Mouth: Mucous membranes are moist.     Pharynx: No oropharyngeal exudate or posterior oropharyngeal erythema.  Eyes:     General:        Right eye: No discharge.        Left eye: No discharge.     Conjunctiva/sclera:     Right eye: Right conjunctiva is not injected. No exudate.    Left eye: Left conjunctiva is not injected. No exudate. Pulmonary:     Effort: Pulmonary effort is normal.  Abdominal:     General: Abdomen is flat.     Palpations: Abdomen is soft. There is no hepatomegaly or mass.     Tenderness: There is no abdominal tenderness. There is no rebound.  Genitourinary:    Comments: Declined genital exam- asymptomatic Lymphadenopathy:     Cervical: No cervical adenopathy.     Upper Body:     Right upper body: No supraclavicular or axillary adenopathy.     Left upper body: No supraclavicular or axillary adenopathy.  Skin:    General: Skin is warm and dry.  Neurological:     Mental Status: He is alert and oriented to person, place, and time.    Assessment and Plan:  Carlos Stewart is a 37 y.o. male  presenting to the [facility] Department for STI screening  1. Screening for venereal disease  - HIV [facility] - Syphilis Serology, [facility] - Chlamydia/GC NAA, Confirmation - Gonococcus culture  Patient does not have STI symptoms Patient accepted all screenings including  urine GC/Chlamydia, and blood work for HIV/Syphilis. Patient meets criteria for HepB screening? No. Ordered? not applicable Patient meets criteria for HepC screening? No. Ordered? not applicable Recommended condom use with all sex Discussed importance of condom use for STI prevent  Treat positive test  results per standing order. Discussed time line for [facility] results and that patient will be called with positive results and encouraged patient to call if he had not heard in 2 weeks Recommended repeat testing in 3 months with positive results. Recommended returning for continued or worsening symptoms.   Return if symptoms worsen or fail to improve.  Future Appointments  Date Time Provider [place]  01/06/2023  3:30 PM Norman Clay, MD [place], [place]

## 2023-01-02 DIAGNOSIS — F321 Major depressive disorder, single episode, moderate: Secondary | ICD-10-CM

## 2023-01-02 MED ORDER — BUPROPION HCL ER (XL) 150 MG PO TB24: 150.0000 mg | ORAL_TABLET | Freq: Every day | ORAL | 0 refills | Status: DC

## 2023-01-03 ENCOUNTER — Other Ambulatory Visit: Payer: Self-pay | Admitting: Psychiatry

## 2023-01-04 LAB — CHLAMYDIA/GC NAA, CONFIRMATION
Chlamydia trachomatis, NAA: NEGATIVE
Neisseria gonorrhoeae, NAA: NEGATIVE

## 2023-01-04 LAB — GONOCOCCUS CULTURE
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
## 2023-01-06 ENCOUNTER — Encounter: Payer: Self-pay | Admitting: Psychiatry

## 2023-01-06 ENCOUNTER — Ambulatory Visit (INDEPENDENT_AMBULATORY_CARE_PROVIDER_SITE_OTHER): Payer: 59 | Admitting: Psychiatry

## 2023-01-06 VITALS — BP 125/80 | HR 88 | Temp 97.0°F | Ht 73.0 in | Wt 195.0 lb

## 2023-01-06 DIAGNOSIS — F419 Anxiety disorder, unspecified: Secondary | ICD-10-CM

## 2023-01-06 DIAGNOSIS — F321 Major depressive disorder, single episode, moderate: Secondary | ICD-10-CM

## 2023-01-06 MED ORDER — MIRTAZAPINE 15 MG PO TABS: ORAL_TABLET | ORAL | 1 refills | Status: DC

## 2023-01-06 NOTE — Patient Instructions (Addendum)
Decrease bupropion 150 mg daily for one week, then discontinue  Once the nose bleed subsides, start mirtazapine 7.5 mg at night for one week, then 15 mg at night  Next appointment: 5/16 at 8:30

## 2023-01-26 NOTE — Addendum Note (Signed)
Addended by: Damiah Mcdonald on: 01/26/2023 01:40 PM   Modules accepted: Orders  

## 2023-02-02 NOTE — Telephone Encounter (Signed)
I've spent a total time of 5 minutes providing service to this patient-generated inquiry in the MyChart message  
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
## 2023-02-16 ENCOUNTER — Ambulatory Visit: Payer: 59 | Admitting: Psychiatry

## 2023-02-16 ENCOUNTER — Encounter: Payer: Self-pay | Admitting: Psychiatry

## 2023-02-16 ENCOUNTER — Telehealth: Payer: Self-pay | Admitting: Psychiatry

## 2023-02-16 VITALS — BP 139/83 | HR 82 | Temp 97.2°F | Ht 73.0 in | Wt 200.0 lb

## 2023-02-16 DIAGNOSIS — F4310 Post-traumatic stress disorder, unspecified: Secondary | ICD-10-CM

## 2023-02-16 DIAGNOSIS — F331 Major depressive disorder, recurrent, moderate: Secondary | ICD-10-CM

## 2023-02-16 DIAGNOSIS — F419 Anxiety disorder, unspecified: Secondary | ICD-10-CM

## 2023-02-16 MED ORDER — VENLAFAXINE HCL ER 75 MG PO CP24: 75.0000 mg | ORAL_CAPSULE | Freq: Every day | ORAL | 1 refills | Status: DC

## 2023-02-16 MED ORDER — VENLAFAXINE HCL ER 37.5 MG PO CP24: 37.5000 mg | ORAL_CAPSULE | Freq: Every day | ORAL | 0 refills | Status: DC

## 2023-02-16 NOTE — Telephone Encounter (Signed)
Created a letter to support his retroactive medical withdrawal. This will be sent to him after we obtain ROI.

## 2023-02-26 ENCOUNTER — Ambulatory Visit: Payer: 59 | Admitting: Psychiatry
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
## 2023-04-02 ENCOUNTER — Encounter: Payer: Self-pay | Admitting: Psychiatry

## 2023-04-02 ENCOUNTER — Telehealth: Payer: 59 | Admitting: Psychiatry

## 2023-04-02 DIAGNOSIS — F331 Major depressive disorder, recurrent, moderate: Secondary | ICD-10-CM

## 2023-04-02 DIAGNOSIS — F419 Anxiety disorder, unspecified: Secondary | ICD-10-CM

## 2023-04-02 DIAGNOSIS — F4310 Post-traumatic stress disorder, unspecified: Secondary | ICD-10-CM | POA: Diagnosis not present

## 2023-04-02 MED ORDER — VENLAFAXINE HCL ER 150 MG PO CP24: 150.0000 mg | ORAL_CAPSULE | Freq: Every day | ORAL | 1 refills | Status: DC

## 2023-04-02 NOTE — Patient Instructions (Signed)
Increase venlafaxine 150 mg daily  Next appointment: 8/8 at 8 am

## 2023-04-21 ENCOUNTER — Encounter (INDEPENDENT_AMBULATORY_CARE_PROVIDER_SITE_OTHER): Payer: 59

## 2023-04-21 DIAGNOSIS — F4310 Post-traumatic stress disorder, unspecified: Secondary | ICD-10-CM

## 2023-04-23 ENCOUNTER — Telehealth: Payer: Self-pay | Admitting: Psychiatry

## 2023-04-23 NOTE — Telephone Encounter (Signed)
I've spent a total time of 20 minutes providing service to this patient-generated inquiry in the MyChart message

## 2023-04-23 NOTE — Telephone Encounter (Signed)
I have created the letter as per his request. Please contact the patient and provide the letter to him.
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
## 2023-05-21 ENCOUNTER — Encounter: Payer: Self-pay | Admitting: Psychiatry

## 2023-05-21 ENCOUNTER — Telehealth (INDEPENDENT_AMBULATORY_CARE_PROVIDER_SITE_OTHER): Payer: 59 | Admitting: Psychiatry

## 2023-05-21 DIAGNOSIS — F4310 Post-traumatic stress disorder, unspecified: Secondary | ICD-10-CM | POA: Diagnosis not present

## 2023-05-21 DIAGNOSIS — F419 Anxiety disorder, unspecified: Secondary | ICD-10-CM

## 2023-05-21 DIAGNOSIS — F331 Major depressive disorder, recurrent, moderate: Secondary | ICD-10-CM | POA: Diagnosis not present

## 2023-05-21 MED ORDER — VENLAFAXINE HCL ER 75 MG PO CP24: 75.0000 mg | ORAL_CAPSULE | Freq: Every day | ORAL | 1 refills | Status: DC

## 2023-05-21 MED ORDER — VENLAFAXINE HCL ER 150 MG PO CP24: 150.0000 mg | ORAL_CAPSULE | Freq: Every day | ORAL | 1 refills | Status: DC

## 2023-05-21 NOTE — Patient Instructions (Signed)
Increase venlafaxine 225 mg daily  Next appointment: 10/1 at 8 am

## 2023-06-30 ENCOUNTER — Ambulatory Visit: Payer: 59 | Admitting: Psychiatry
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
## 2023-07-14 ENCOUNTER — Ambulatory Visit: Payer: 59 | Admitting: Psychiatry

## 2023-07-14 ENCOUNTER — Encounter: Payer: Self-pay | Admitting: Psychiatry

## 2023-07-14 VITALS — BP 126/80 | HR 91 | Temp 98.7°F | Ht 73.0 in | Wt 219.0 lb

## 2023-07-14 DIAGNOSIS — R632 Polyphagia: Secondary | ICD-10-CM

## 2023-07-14 DIAGNOSIS — F4310 Post-traumatic stress disorder, unspecified: Secondary | ICD-10-CM

## 2023-07-14 DIAGNOSIS — F419 Anxiety disorder, unspecified: Secondary | ICD-10-CM | POA: Diagnosis not present

## 2023-07-14 DIAGNOSIS — F331 Major depressive disorder, recurrent, moderate: Secondary | ICD-10-CM | POA: Diagnosis not present

## 2023-07-14 MED ORDER — VENLAFAXINE HCL ER 150 MG PO CP24: 150.0000 mg | ORAL_CAPSULE | Freq: Every day | ORAL | 1 refills | Status: DC

## 2023-07-14 MED ORDER — VENLAFAXINE HCL ER 75 MG PO CP24: 75.0000 mg | ORAL_CAPSULE | Freq: Every day | ORAL | 2 refills | Status: DC

## 2023-07-14 MED ORDER — TOPIRAMATE 25 MG PO CPSP: 25.0000 mg | ORAL_CAPSULE | Freq: Every day | ORAL | 2 refills | Status: DC

## 2023-07-14 NOTE — Patient Instructions (Signed)
Continue venlafaxine 225 mg daily  Start topiramate 25 mg daily  Next appointment: 12/12 at 8 am

## 2023-07-15 ENCOUNTER — Emergency Department: Payer: 59

## 2023-07-15 DIAGNOSIS — S61011A Laceration without foreign body of right thumb without damage to nail, initial encounter: Secondary | ICD-10-CM | POA: Diagnosis not present

## 2023-07-15 DIAGNOSIS — W312XXA Contact with powered woodworking and forming machines, initial encounter: Secondary | ICD-10-CM | POA: Diagnosis not present

## 2023-07-15 DIAGNOSIS — Z23 Encounter for immunization: Secondary | ICD-10-CM | POA: Insufficient documentation

## 2023-07-15 DIAGNOSIS — S6992XA Unspecified injury of left wrist, hand and finger(s), initial encounter: Secondary | ICD-10-CM | POA: Diagnosis present
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
## 2023-07-16 ENCOUNTER — Other Ambulatory Visit: Payer: Self-pay

## 2023-07-16 ENCOUNTER — Emergency Department
Admission: EM | Admit: 2023-07-16 | Discharge: 2023-07-16 | Disposition: A | Discharge status: Home / Self Care | Payer: 59 | Attending: Emergency Medicine | Admitting: Emergency Medicine

## 2023-07-16 DIAGNOSIS — S61011A Laceration without foreign body of right thumb without damage to nail, initial encounter: Secondary | ICD-10-CM

## 2023-07-16 HISTORY — DX: Depression, unspecified: F32.A

## 2023-07-16 HISTORY — DX: Post-traumatic stress disorder, unspecified: F43.10

## 2023-07-16 MED ORDER — TETANUS-DIPHTH-ACELL PERTUSSIS 5-2.5-18.5 LF-MCG/0.5 IM SUSY
0.5000 mL | PREFILLED_SYRINGE | Freq: Once | INTRAMUSCULAR | Status: AC
  Administered 2023-07-16: 0.5 mL via INTRAMUSCULAR
  Filled 2023-07-16: qty 0.5

## 2023-07-16 MED ORDER — LIDOCAINE HCL (PF) 1 % IJ SOLN
5.0000 mL | Freq: Once | INTRAMUSCULAR | Status: AC
  Administered 2023-07-16: 5 mL
  Filled 2023-07-16: qty 5

## 2023-07-16 NOTE — ED Provider Notes (Signed)
[facility] Provider Note    Event Date/Time   First MD Initiated Contact with Patient 07/16/23 0204     (approximate)  History   Chief Complaint: Thumb Laceration  HPI  Carlos Stewart is a 38 y.o. male with a past medical history of ADHD, presents to the emergency department for laceration to his right thumb.  According to the patient this evening he was using a band saw and accidentally cut his right thumb pad.  Patient states bleeding which is now controlled.  Last tetanus he believes was 2014.  No other injuries or concerns.  Physical Exam   Triage Vital Signs: ED Triage Vitals [07/15/23 2159]  Encounter Vitals Group     BP (!) 137/103     Systolic BP Percentile      Diastolic BP Percentile      Pulse Rate 90     Resp 20     Temp 98.5 F (36.9 C)     Temp Source Oral     SpO2 97 %     Weight 220 lb (99.8 kg)     Height 6\' 1"  (1.854 m)     Head Circumference      Peak Flow      Pain Score 6     Pain Loc      Pain Education      Exclude from Growth Chart     Most recent vital signs: Vitals:   07/15/23 2159  BP: (!) 137/103  Pulse: 90  Resp: 20  Temp: 98.5 F (36.9 C)  SpO2: 97%    General: Awake, no distress.  CV:  Good peripheral perfusion.  Regular rate and rhythm  Resp:  Normal effort.   Abd:  No distention.   Other:  Patient has an approximate 1.5 cm laceration to the thumb pad of his right thumb.  Hemostatic however after rinsing mild bleeding.  Neuro vastly intact distally.  Good cap refill.   ED Results / Procedures / Treatments   RADIOLOGY  I reviewed and interpreted the x-ray images I do not see any obvious foreign body on my evaluation bones appear normal. Radiology is read the x-ray is negative.   MEDICATIONS ORDERED IN ED: Medications  lidocaine (PF) (XYLOCAINE) 1 % injection 5 mL (5 mLs Infiltration Given 07/16/23 0216)  Tdap (BOOSTRIX) injection 0.5 mL (0.5 mLs Intramuscular Given 07/16/23 0217)      IMPRESSION / MDM / ASSESSMENT AND PLAN / ED COURSE  I reviewed the triage vital signs and the nursing notes.  Patient's presentation is most consistent with acute illness / injury with system symptoms.  Patient presents emergency department for a laceration to his right thumb.  Approximately 1.5 cm, currently hemostatic.  We will irrigate and repair with sutures.  Will update the patient's tetanus.  Patient agreeable to plan.  LACERATION REPAIR Performed by: Minna Antis Authorized by: Minna Antis Consent: Verbal consent obtained. Risks and benefits: risks, benefits and alternatives were discussed Consent given by: patient Patient identity confirmed: provided demographic data Prepped and Draped in normal sterile fashion Wound explored  Laceration Location: Right thumb  Laceration Length: 1.5 cm  No Foreign Bodies seen or palpated  Anesthesia: local infiltration  Local anesthetic: lidocaine 1% without epinephrine  Anesthetic total: 2 ml  Irrigation method: syringe Amount of cleaning: standard  Skin closure: 4-0 Ethilon  Number of sutures: 3  Technique: Simple interrupted  Patient tolerance: Patient tolerated the procedure well with no immediate complications.  FINAL CLINICAL IMPRESSION(S) / ED DIAGNOSES   Thumb laceration   Note:  This document was prepared using Dragon voice recognition software and may include unintentional dictation errors.   Minna Antis, MD 07/16/23 [phone]

## 2023-07-16 NOTE — Discharge Instructions (Signed)
Please follow-up with your doctor or other healthcare provider for suture removal in approximately 7 to 10 days.  Please keep the area covered with Neosporin and a bandage.  You may wash the area gently with warm water and soap.  Do not rub the area as this may disrupt the sutures.  Your tetanus shot has been updated in the emergency department.  Return to the emergency department for any signs of infection such as increased pain fever or pus.
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
## 2023-09-24 ENCOUNTER — Encounter: Payer: Self-pay | Admitting: Psychiatry

## 2023-09-24 ENCOUNTER — Ambulatory Visit: Payer: 59 | Admitting: Psychiatry

## 2023-09-24 VITALS — BP 133/90 | HR 93 | Temp 98.3°F | Ht 73.0 in | Wt 225.4 lb

## 2023-09-24 DIAGNOSIS — R632 Polyphagia: Secondary | ICD-10-CM

## 2023-09-24 DIAGNOSIS — F330 Major depressive disorder, recurrent, mild: Secondary | ICD-10-CM

## 2023-09-24 DIAGNOSIS — F4310 Post-traumatic stress disorder, unspecified: Secondary | ICD-10-CM | POA: Diagnosis not present

## 2023-09-24 MED ORDER — VENLAFAXINE HCL ER 75 MG PO CP24: 75.0000 mg | ORAL_CAPSULE | Freq: Every day | ORAL | 3 refills | Status: DC

## 2023-09-24 MED ORDER — VENLAFAXINE HCL ER 150 MG PO CP24: 150.0000 mg | ORAL_CAPSULE | Freq: Every day | ORAL | 3 refills | Status: DC

## 2023-09-24 MED ORDER — TOPIRAMATE 50 MG PO TABS: 50.0000 mg | ORAL_TABLET | Freq: Every day | ORAL | 1 refills | Status: DC

## 2023-09-24 NOTE — Patient Instructions (Signed)
Continue venlafaxine 225 mg daily  Increase topiramate 50 mg at night Next appointment: 1/30 at 8:3
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
## 2023-11-12 ENCOUNTER — Ambulatory Visit: Payer: 59 | Admitting: Psychiatry
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
## 2023-11-23 ENCOUNTER — Encounter: Payer: Self-pay | Admitting: Psychiatry

## 2023-11-23 ENCOUNTER — Ambulatory Visit (INDEPENDENT_AMBULATORY_CARE_PROVIDER_SITE_OTHER): Payer: 59 | Admitting: Psychiatry

## 2023-11-23 VITALS — BP 138/88 | HR 103 | Temp 98.5°F | Ht 73.0 in | Wt 222.8 lb

## 2023-11-23 DIAGNOSIS — R632 Polyphagia: Secondary | ICD-10-CM | POA: Diagnosis not present

## 2023-11-23 DIAGNOSIS — F330 Major depressive disorder, recurrent, mild: Secondary | ICD-10-CM | POA: Diagnosis not present

## 2023-11-23 DIAGNOSIS — F4310 Post-traumatic stress disorder, unspecified: Secondary | ICD-10-CM | POA: Diagnosis not present

## 2023-11-23 MED ORDER — TOPIRAMATE 50 MG PO TABS: 50.0000 mg | ORAL_TABLET | Freq: Every day | ORAL | 1 refills | Status: DC
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
## 2024-01-18 ENCOUNTER — Other Ambulatory Visit: Payer: Self-pay

## 2024-01-18 ENCOUNTER — Encounter: Payer: Self-pay | Admitting: Psychiatry

## 2024-01-18 ENCOUNTER — Ambulatory Visit (INDEPENDENT_AMBULATORY_CARE_PROVIDER_SITE_OTHER): Payer: Self-pay | Admitting: Psychiatry

## 2024-01-18 VITALS — BP 133/90 | HR 101 | Temp 95.7°F | Ht 73.0 in | Wt 225.6 lb

## 2024-01-18 DIAGNOSIS — F330 Major depressive disorder, recurrent, mild: Secondary | ICD-10-CM

## 2024-01-18 DIAGNOSIS — F4310 Post-traumatic stress disorder, unspecified: Secondary | ICD-10-CM

## 2024-01-18 DIAGNOSIS — R632 Polyphagia: Secondary | ICD-10-CM | POA: Diagnosis not present

## 2024-01-18 MED ORDER — VENLAFAXINE HCL ER 75 MG PO CP24: 75.0000 mg | ORAL_CAPSULE | Freq: Every day | ORAL | 5 refills | Status: DC

## 2024-01-18 MED ORDER — VENLAFAXINE HCL ER 150 MG PO CP24: 150.0000 mg | ORAL_CAPSULE | Freq: Every day | ORAL | 5 refills | Status: DC

## 2024-01-18 MED ORDER — TOPIRAMATE 100 MG PO TABS: 100.0000 mg | ORAL_TABLET | Freq: Every day | ORAL | 1 refills | Status: DC

## 2024-01-18 NOTE — Patient Instructions (Signed)
 Continue venlafaxine 225 mg daily  Increase topiramate 100 mg at night Next appointment: 5/19 at 8:30

## 2024-01-29 ENCOUNTER — Other Ambulatory Visit: Payer: Self-pay | Admitting: Psychiatry

## 2024-02-01 ENCOUNTER — Ambulatory Visit: Admitting: Family Medicine

## 2024-02-01 ENCOUNTER — Ambulatory Visit

## 2024-02-01 DIAGNOSIS — Z113 Encounter for screening for infections with a predominantly sexual mode of transmission: Secondary | ICD-10-CM

## 2024-02-01 LAB — HM HIV SCREENING LAB: HM HIV Screening: NEGATIVE

## 2024-02-01 NOTE — Progress Notes (Signed)
 Pt is here for STD screening. Condoms given. Sonda Primes, RN.

## 2024-02-01 NOTE — Progress Notes (Signed)
 [facility] Department STI clinic [address], [address] [place] [address] Main phone: [phone]  STI screening visit  Subjective:  Carlos Stewart is a 38 y.o. male being seen today for an STI screening visit. The patient reports they do have symptoms.    Patient has the following medical conditions:  Patient Active Problem List   Diagnosis Date Noted   Migraine without aura and without status migrainosus, not intractable 05/07/2022   Post concussion syndrome 07/28/2019   Chief Complaint  Patient presents with   SEXUALLY TRANSMITTED DISEASE   HPI Patient reports he would like routine asymptomatic screening. No concerns for specific exposure. Likes to get screened twice yearly.   STI screening history: Last HIV test per patient/review of record was  Lab Results  Component Value Date   HMHIVSCREEN Negative - Validated 12/31/2022   No results found for: "HIV"  Last HEPC test per patient/review of record was  Lab Results  Component Value Date   HMHEPCSCREEN Negative-Validated 08/28/2022   No components found for: "HEPC"   Last HEPB test per patient/review of record was No components found for: "HMHEPBSCREEN"   Fertility: Does the patient or their partner desires a pregnancy in the next year? No  Screening for MPX risk: Does the patient have an unexplained rash? No Is the patient MSM? No Does the patient endorse multiple sex partners or anonymous sex partners? No Did the patient have close or sexual contact with a person diagnosed with MPX? No Has the patient traveled outside the [place]  where MPX is endemic? No Is there a high clinical suspicion for MPX-- evidenced by one of the following No  -Unlikely to be chickenpox  -Lymphadenopathy  -Rash that present in same phase of evolution on any given body part  See flowsheet for further details and programmatic requirements.   Immunization History  Administered Date(s) Administered    Hepatitis B 04/17/2021   Influenza,inj,Quad PF,6+ Mos 07/25/2019   Influenza-Unspecified 07/25/2019, 04/02/2021   Moderna Covid-19 Fall Seasonal Vaccine 59yrs & older 09/19/2022, 06/19/2023   PFIZER(Purple Top)SARS-COV-2 Vaccination 12/03/2019, 12/28/2019   PPD Test 04/02/2021   Tdap 04/02/2021, 07/16/2023    The following portions of the patient's history were reviewed and updated as appropriate: allergies, current medications, past medical history, past social history, past surgical history and problem list.  Objective:  There were no vitals filed for this visit.  Physical Exam Vitals and nursing note reviewed.  Constitutional:      General: He is not in acute distress.    Appearance: Normal appearance. He is normal weight. He is not ill-appearing, toxic-appearing or diaphoretic.  HENT:     Head: Normocephalic and atraumatic.     Mouth/Throat:     Mouth: Mucous membranes are moist.     Pharynx: No oropharyngeal exudate or posterior oropharyngeal erythema.  Eyes:     General: No scleral icterus.       Right eye: No discharge.        Left eye: No discharge.     Conjunctiva/sclera: Conjunctivae normal.     Right eye: Right conjunctiva is not injected. No exudate.    Left eye: Left conjunctiva is not injected. No exudate. Pulmonary:     Effort: Pulmonary effort is normal.  Abdominal:     Palpations: There is no hepatomegaly.  Genitourinary:    Comments: Declined genital exam- asymptomatic Musculoskeletal:        General: Normal range of motion.     Cervical back:  Neck supple. No rigidity or tenderness.  Lymphadenopathy:     Cervical: No cervical adenopathy.     Right cervical: No superficial or posterior cervical adenopathy.    Left cervical: No superficial or posterior cervical adenopathy.     Upper Body:     Right upper body: No supraclavicular adenopathy.     Left upper body: No supraclavicular adenopathy.  Skin:    General: Skin is warm and dry.     Capillary Refill:  Capillary refill takes less than 2 seconds.  Neurological:     General: No focal deficit present.     Mental Status: He is alert and oriented to person, place, and time.  Psychiatric:        Mood and Affect: Mood normal.        Behavior: Behavior normal.   Assessment and Plan:  Carlos Stewart is a 38 y.o. male presenting to the [facility] Department for STI screening  Screening examination for venereal disease -     Gonococcus culture -     Chlamydia/GC NAA, Confirmation -     HIV [facility] -     Syphilis Serology, [facility]  Patient does not have STI symptoms Patient accepted all screenings including  urine GC/Chlamydia, and blood work for HIV/Syphilis. Patient meets criteria for HepB screening? No. Ordered? not applicable Patient meets criteria for HepC screening? No. Ordered? not applicable Recommended condom use with all sex Discussed importance of condom use for STI prevention  Treat positive test results per standing order. Discussed time line for [facility] results and that patient will be called with positive results and encouraged patient to call if he had not heard in 2 weeks Recommended repeat testing in 3 months with positive results. Recommended returning for continued or worsening symptoms.   No follow-ups on file.  Future Appointments  Date Time Provider Department Center  02/29/2024  8:30 AM Todd Fossa, MD [facility] None   Tempie Fee, MD 02/01/24  1:39 PM

## 2024-02-01 NOTE — Patient Instructions (Signed)
 STI screening - Today we obtained a vaginal swab to screen for gonorrhea, chlamydia, and trichomonas - We also obtained a blood sample to screen for HIV and syphilis - If the results are abnormal, I will give you a call.    Estimated time frame for results collected at the [facility] Department: Same day Trichomonas Yeast BV (bacterial vaginosis)  Within 1-2 weeks Gonorrhea Chlamydia  Within 2-3 weeks HIV Syphilis Hepatitis B Hepatitis C

## 2024-02-06 LAB — CHLAMYDIA/GC NAA, CONFIRMATION
Chlamydia trachomatis, NAA: NEGATIVE
Neisseria gonorrhoeae, NAA: NEGATIVE

## 2024-02-06 LAB — GONOCOCCUS CULTURE
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
## 2024-02-29 ENCOUNTER — Ambulatory Visit: Admitting: Psychiatry

## 2024-03-13 ENCOUNTER — Other Ambulatory Visit: Payer: Self-pay | Admitting: Psychiatry

## 2024-03-13 NOTE — Telephone Encounter (Signed)
 Please advise to schedule a visit. Virtual is fine if he prefers that option.

## 2024-03-14 NOTE — Telephone Encounter (Signed)
 Patient cancelled May appointment due to insurance not covering as provider. He was calling to verify and call back. A message left today asking him to call and give an update regarding provider coverage at this office.

## 2024-03-17 ENCOUNTER — Encounter: Payer: Self-pay | Admitting: Psychiatry

## 2024-03-17 ENCOUNTER — Other Ambulatory Visit: Payer: Self-pay

## 2024-03-17 ENCOUNTER — Ambulatory Visit (INDEPENDENT_AMBULATORY_CARE_PROVIDER_SITE_OTHER): Admitting: Psychiatry

## 2024-03-17 VITALS — BP 134/86 | HR 106 | Temp 97.3°F | Ht 73.0 in | Wt 226.0 lb

## 2024-03-17 DIAGNOSIS — F330 Major depressive disorder, recurrent, mild: Secondary | ICD-10-CM

## 2024-03-17 DIAGNOSIS — F4310 Post-traumatic stress disorder, unspecified: Secondary | ICD-10-CM | POA: Diagnosis not present

## 2024-03-17 DIAGNOSIS — R632 Polyphagia: Secondary | ICD-10-CM

## 2024-03-17 MED ORDER — TOPIRAMATE 100 MG PO TABS: 100.0000 mg | ORAL_TABLET | Freq: Every day | ORAL | 0 refills | Status: DC

## 2024-03-17 NOTE — Addendum Note (Signed)
 Addended by: Kenadi Miltner on: 03/17/2024 12:32 PM   Modules accepted: Orders
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
## 2024-05-02 ENCOUNTER — Ambulatory Visit (INDEPENDENT_AMBULATORY_CARE_PROVIDER_SITE_OTHER): Admitting: Psychiatry

## 2024-05-02 ENCOUNTER — Encounter: Payer: Self-pay | Admitting: Psychiatry

## 2024-05-02 VITALS — BP 124/86 | HR 78 | Temp 97.6°F | Ht 73.0 in | Wt 228.6 lb

## 2024-05-02 DIAGNOSIS — F330 Major depressive disorder, recurrent, mild: Secondary | ICD-10-CM

## 2024-05-02 DIAGNOSIS — F4310 Post-traumatic stress disorder, unspecified: Secondary | ICD-10-CM | POA: Diagnosis not present

## 2024-05-02 DIAGNOSIS — R632 Polyphagia: Secondary | ICD-10-CM

## 2024-05-02 MED ORDER — TOPIRAMATE 50 MG PO TABS
50.0000 mg | ORAL_TABLET | Freq: Every evening | ORAL | 1 refills | Status: DC
Start: 2024-05-02 — End: 2024-06-27

## 2024-05-02 NOTE — Patient Instructions (Signed)
 Continue venlafaxine  225 mg daily  Decrease topiramate  50 mg at night Next appointment:  9/15 at 8 am
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
## 2024-06-27 ENCOUNTER — Ambulatory Visit (INDEPENDENT_AMBULATORY_CARE_PROVIDER_SITE_OTHER): Admitting: Psychiatry

## 2024-06-27 ENCOUNTER — Other Ambulatory Visit: Payer: Self-pay

## 2024-06-27 ENCOUNTER — Encounter: Payer: Self-pay | Admitting: Psychiatry

## 2024-06-27 VITALS — BP 111/78 | HR 71 | Temp 96.7°F | Ht 73.0 in | Wt 229.2 lb

## 2024-06-27 DIAGNOSIS — F4310 Post-traumatic stress disorder, unspecified: Secondary | ICD-10-CM

## 2024-06-27 DIAGNOSIS — F330 Major depressive disorder, recurrent, mild: Secondary | ICD-10-CM

## 2024-06-27 DIAGNOSIS — F5081 Binge eating disorder, mild: Secondary | ICD-10-CM

## 2024-06-27 MED ORDER — TOPIRAMATE 100 MG PO TABS: 100.0000 mg | ORAL_TABLET | Freq: Every evening | ORAL | 1 refills | Status: DC

## 2024-06-27 MED ORDER — VENLAFAXINE HCL ER 150 MG PO CP24: 150.0000 mg | ORAL_CAPSULE | Freq: Every day | ORAL | 5 refills | Status: AC

## 2024-06-27 NOTE — Patient Instructions (Signed)
 Continue venlafaxine  225 mg daily Increase topiramate  100 mg daily Next appointment:  11/11 at 8 am,
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
## 2024-08-23 ENCOUNTER — Ambulatory Visit (INDEPENDENT_AMBULATORY_CARE_PROVIDER_SITE_OTHER): Admitting: Psychiatry

## 2024-08-23 ENCOUNTER — Other Ambulatory Visit: Payer: Self-pay

## 2024-08-23 ENCOUNTER — Encounter: Payer: Self-pay | Admitting: Psychiatry

## 2024-08-23 VITALS — BP 129/87 | HR 64 | Temp 97.5°F | Ht 73.0 in | Wt 242.0 lb

## 2024-08-23 DIAGNOSIS — F330 Major depressive disorder, recurrent, mild: Secondary | ICD-10-CM | POA: Diagnosis not present

## 2024-08-23 DIAGNOSIS — Z79899 Other long term (current) drug therapy: Secondary | ICD-10-CM

## 2024-08-23 DIAGNOSIS — F5081 Binge eating disorder, mild: Secondary | ICD-10-CM

## 2024-08-23 DIAGNOSIS — F4310 Post-traumatic stress disorder, unspecified: Secondary | ICD-10-CM | POA: Diagnosis not present

## 2024-08-23 DIAGNOSIS — F909 Attention-deficit hyperactivity disorder, unspecified type: Secondary | ICD-10-CM

## 2024-08-23 MED ORDER — VENLAFAXINE HCL ER 75 MG PO CP24: 75.0000 mg | ORAL_CAPSULE | Freq: Every day | ORAL | 5 refills | Status: AC

## 2024-08-23 MED ORDER — LISDEXAMFETAMINE DIMESYLATE 30 MG PO CAPS
30.0000 mg | ORAL_CAPSULE | Freq: Every day | ORAL | 0 refills | Status: AC
Start: 2024-08-23 — End: 2024-09-22

## 2024-08-23 NOTE — Patient Instructions (Signed)
 Continue venlafaxine  225 mg daily Start Vyvanse 30 mg daily  Hold Adderall  Obtain urine screening Hold topiramate   Next appointment:  1/8 at 8 am,

## 2024-09-13 ENCOUNTER — Encounter: Payer: Self-pay | Admitting: Urology

## 2024-09-13 ENCOUNTER — Ambulatory Visit: Admitting: Urology

## 2024-09-13 VITALS — BP 113/81 | HR 103 | Ht 73.0 in | Wt 240.0 lb

## 2024-09-13 DIAGNOSIS — N528 Other male erectile dysfunction: Secondary | ICD-10-CM | POA: Diagnosis not present

## 2024-09-13 MED ORDER — SILDENAFIL CITRATE 100 MG PO TABS: ORAL_TABLET | ORAL | 2 refills | Status: AC

## 2024-09-13 NOTE — Progress Notes (Signed)
 09/13/2024 7:57 AM   Carlos Stewart 05-02-1986 [id number]  Referring provider: No referring provider defined for this encounter.  Chief Complaint  Patient presents with   Erectile Dysfunction   Urologic history: 1.  Erectile dysfunction Sildenafil  100 mg prn  HPI: Carlos Stewart is a 38 y.o. male who presents for medication refill visit  Presents for sildenafil  refill which has been effective for his ED Does complain of delayed ejaculation.  Is on Effexor  225 mg daily Good libido, some tiredness/fatigue   PMH: Past Medical History:  Diagnosis Date   ADHD (attention deficit hyperactivity disorder)    Depression    Migraine headache without aura    Post concussion syndrome    PTSD (post-traumatic stress disorder)     Surgical History: Past Surgical History:  Procedure Laterality Date   SINUSOTOMY      Home Medications:  Allergies as of 09/13/2024       Reactions   Justicia Adhatoda (malabar Nut Tree) [justicia Adhatoda] Anaphylaxis   Tree Nuts and Coconut   Ceclor [cefaclor] Other (See Comments)   Pt had reaction as a child, extreme sweating, hyperactivity   Penicillins Hives, Itching   Edema        Medication List        Accurate as of September 13, 2024  7:57 AM. If you have any questions, ask your nurse or doctor.          Adderall 20 MG tablet Generic drug: amphetamine-dextroamphetamine Take 20 mg by mouth daily.   albuterol 108 (90 Base) MCG/ACT inhaler Commonly known as: VENTOLIN HFA INHALE 2 PUFFS BY MOUTH EVERY 4 TO 6 HOURS AS NEEDED FOR COUGH FOR SHORTNESS OF BREATH OR CHEST TIGHTNESS   diphenhydramine-acetaminophen 25-500 MG Tabs tablet Commonly known as: TYLENOL PM Take 1 tablet by mouth at bedtime as needed.   lisdexamfetamine 30 MG capsule Commonly known as: Vyvanse  Take 1 capsule (30 mg total) by mouth daily.   rosuvastatin 40 MG tablet Commonly known as: CRESTOR Take 40 mg by mouth daily.   sildenafil  100 MG  tablet Commonly known as: VIAGRA  1 tab 1 hour prior to intercourse   SUMAtriptan 100 MG tablet Commonly known as: IMITREX Take by mouth.   venlafaxine  XR 150 MG 24 hr capsule Commonly known as: EFFEXOR -XR Take 1 capsule (150 mg total) by mouth daily with breakfast. Take total of 225 mg daily. Take along with 75 mg cap   venlafaxine  XR 75 MG 24 hr capsule Commonly known as: EFFEXOR -XR Take 1 capsule (75 mg total) by mouth daily. Take total of 225 mg daily. Take along with 150 mg cap        Allergies:  Allergies  Allergen Reactions   Justicia Adhatoda (Malabar Nut Tree) [Justicia Adhatoda] Anaphylaxis    Tree Nuts and Coconut   Ceclor [Cefaclor] Other (See Comments)    Pt had reaction as a child, extreme sweating, hyperactivity   Penicillins Hives and Itching    Edema    Family History: History reviewed. No pertinent family history.  Social History:  reports that he has never smoked. He has never used smokeless tobacco. He reports current alcohol use of about 5.0 standard drinks of alcohol per week. He reports current drug use. Drug: Marijuana.   Physical Exam: BP 113/81   Pulse (!) 103   Ht 6' 1 (1.854 m)   Wt 240 lb (108.9 kg)   BMI 31.66 kg/m   Constitutional:  Alert, No acute distress. HEENT:   AT Respiratory: Normal respiratory effort, no increased work of breathing. Psychiatric: Normal mood and affect.   Assessment & Plan:    1.  Erectile dysfunction Sildenafil  refilled  2.  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

## 2024-09-14 ENCOUNTER — Encounter: Payer: Self-pay | Admitting: Urology

## 2024-09-14 LAB — TESTOSTERONE: Testosterone: 152 ng/dL — ABNORMAL LOW (ref 264–916)

## 2024-09-15 ENCOUNTER — Ambulatory Visit: Payer: Self-pay | Admitting: Urology

## 2024-09-15 DIAGNOSIS — R7989 Other specified abnormal findings of blood chemistry: Secondary | ICD-10-CM

## 2024-09-21 ENCOUNTER — Other Ambulatory Visit

## 2024-09-21 DIAGNOSIS — R7989 Other specified abnormal findings of blood chemistry: Secondary | ICD-10-CM

## 2024-09-22 ENCOUNTER — Ambulatory Visit: Payer: Self-pay | Admitting: Urology

## 2024-09-22 ENCOUNTER — Other Ambulatory Visit: Payer: Self-pay | Admitting: Psychiatry

## 2024-09-22 LAB — LUTEINIZING HORMONE: LH: 9 m[IU]/mL — ABNORMAL HIGH (ref 1.7–8.6)

## 2024-09-22 LAB — TESTOSTERONE: Testosterone: 351 ng/dL (ref 264–916)

## 2024-09-22 MED ORDER — LISDEXAMFETAMINE DIMESYLATE 30 MG PO CAPS: 30.0000 mg | ORAL_CAPSULE | Freq: Every day | ORAL | 0 refills | Status: DC
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
## 2024-10-20 ENCOUNTER — Encounter: Payer: Self-pay | Admitting: Psychiatry

## 2024-10-20 ENCOUNTER — Other Ambulatory Visit: Payer: Self-pay

## 2024-10-20 ENCOUNTER — Ambulatory Visit: Payer: PRIVATE HEALTH INSURANCE

## 2024-10-20 ENCOUNTER — Ambulatory Visit (INDEPENDENT_AMBULATORY_CARE_PROVIDER_SITE_OTHER): Admitting: Psychiatry

## 2024-10-20 VITALS — BP 124/72 | HR 88 | Temp 97.8°F | Ht 73.0 in | Wt 223.8 lb

## 2024-10-20 DIAGNOSIS — F909 Attention-deficit hyperactivity disorder, unspecified type: Secondary | ICD-10-CM

## 2024-10-20 DIAGNOSIS — F5081 Binge eating disorder, mild: Secondary | ICD-10-CM | POA: Diagnosis not present

## 2024-10-20 DIAGNOSIS — F4310 Post-traumatic stress disorder, unspecified: Secondary | ICD-10-CM | POA: Diagnosis not present

## 2024-10-20 DIAGNOSIS — Z719 Counseling, unspecified: Secondary | ICD-10-CM

## 2024-10-20 DIAGNOSIS — F330 Major depressive disorder, recurrent, mild: Secondary | ICD-10-CM

## 2024-10-20 DIAGNOSIS — Z113 Encounter for screening for infections with a predominantly sexual mode of transmission: Secondary | ICD-10-CM

## 2024-10-20 LAB — HM HIV SCREENING LAB: HM HIV Screening: NEGATIVE

## 2024-10-20 MED ORDER — LISDEXAMFETAMINE DIMESYLATE 40 MG PO CAPS: 40.0000 mg | ORAL_CAPSULE | Freq: Every day | ORAL | 0 refills | Status: AC

## 2024-10-20 NOTE — Progress Notes (Signed)
 Pt is here STD screening. Condoms given. Sonda Primes, RN.

## 2024-10-20 NOTE — Progress Notes (Signed)
 " [facility] Department STI clinic [address], [address] [place] [address] Main phone: [phone]  STI screening visit  Subjective:  Carlos Stewart is a 39 y.o. male being seen today for an STI screening visit. The patient reports they do not have symptoms.    Patient has the following medical conditions:  Patient Active Problem List   Diagnosis Date Noted   Migraine without aura and without status migrainosus, not intractable 05/07/2022   Post concussion syndrome 07/28/2019   Chief Complaint  Patient presents with   SEXUALLY TRANSMITTED DISEASE    STD screening/no symptoms    HPI Patient reports desire for asymptomatic STI testing.  Reproductive considerations: Does the patient or their partner desires a pregnancy in the next year? No  See flowsheet for further details and programmatic requirements  Hyperlink available at the top of the signed note in blue.  Flow sheet content below:  Pregnancy Intention Screening Does the patient want to become pregnant in the next year?: N/A Does the patient's partner want to become pregnant in the next year?: No Would the patient like to discuss contraceptive options today?: N/A Reason For STD Screen STD Screening: Is asymptomatic Have you ever had an STD?: No History of Antibiotic use in the past 2 weeks?: No STD Symptoms Denies all: Yes Risk Factors for Hep B Household, sexual, or needle sharing contact of a person infected with Hep B: No Sexual contact with a person who uses drugs not as prescribed?: No Currently or Ever used drugs not as prescribed: No HIV Positive: No PRep Patient: No Men who have sex with men: No Have Hepatitis C: No History of Incarceration: No History of Homeslessness?: No Anal sex following anal drug use?: No Risk Factors for Hep C Currently using drugs not as prescribed: No Sexual partner(s) currently using drugs as not prescribed: No History of drug use: No HIV  Positive: No People with a history of incarceration: No People born between the years of 31 and 41: No Counseling Patient counseled to use condoms with all sex: Condoms given Test results given to patient Patient counseled to use condoms with all sex: Condoms given  Screening for MPX risk:  Unexplained rash?  No   MSM?  No   Multiple or anonymous sex partners?  No   Any close or sexual contact with a person  diagnosed with MPX?  No   Any outside the [place]  where MPX is endemic?  No   High clinical suspicion for MPX?    -Unlikely to be chickenpox    -Lymphadenopathy    -Rash that presents in same phase of       evolution on any given body part  No   Does this patient meet CDC recommendations for vaccination against MPOX? No  You already have or anticipate having the following risks:  Your sex partner has the following risks: You're traveling to a county with a clade I MPOX outbreak and anticipate these risks: Occupational exposure  You had known or suspected exposure to someone with monkeypox You had a sex partner in the past 2 weeks who was diagnosed with monkeypox You are a gay, bisexual, or other man who has sex with men, or are transgender or nonbinary and in the past 6 months have had any of the following: - A new diagnosis of one or more sexually transmitted diseases (e.g., chlamydia, gonorrhea, or syphilis) - More than one sex partner You have had any of the  following in the past 6 months: - Sex at a commercial sex venue (like a sex club or bathhouse) - Sex related to a large commercial event   or in a geographic area (city or county for example) where mpox virus transmission is occurring Sex with a new partner Sex at a commercial sex venue (e.g., a sex club or bathhouse) Sex in it consultant for money, goods, drugs, or other trade Sex in association with a large public event (e.g., a rave, party, or festival) i.e. certain people who work in a tax adviser or healthcare facility    Infectious disease screenings: Vaccinated against HPV? No: has vaccine appt today  HIV Ever had a positive? No Last test: 2025 Results in chart:  Lab Results  Component Value Date   HMHIVSCREEN Negative - Validated 02/01/2024   No results found for: HIV   Hep B Hep B status: unknown or no prior testing Received HBV vaccination? Yes Received HBV testing for immunity? Yes, immune Results in chart:  No components found for: HMHEPBSCREEN  Do they qualify for HBV screening today? No Criteria:  -Household, sexual or needle sharing contact with HBV -History of drug use or homelessness -HIV positive -Those with known Hep C  Hep C Hep C status: negative on 2023 Results in chart:  Lab Results  Component Value Date   HMHEPCSCREEN Negative-Validated 08/28/2022   No components found for: HEPC  Do they qualify for HCV screening today? No Criteria - since the last HCV result, does the patient have any of the following? - Current drug use - Have a partner with drug use - Has been incarcerated  Immunization history:  Immunization History  Administered Date(s) Administered   Hepatitis B 04/17/2021   Influenza,inj,Quad PF,6+ Mos 07/25/2019   Influenza-Unspecified 07/25/2019, 04/02/2021   Moderna Covid-19 Fall Seasonal Vaccine 74yrs & older 09/19/2022, 06/19/2023   PFIZER(Purple Top)SARS-COV-2 Vaccination 12/03/2019, 12/28/2019   PPD Test 04/02/2021   Tdap 04/02/2021, 07/16/2023    The following portions of the patient's history were reviewed and updated as appropriate: allergies, current medications, past medical history, past social history, past surgical history and problem list.  Substance use screenings:  Uses tobacco products? No Uses vapes? No Uses alcohol? No Uses non-injectable substances that alter your mental status? No Uses non-prescribed injectable substances? No  Immunization History  Administered Date(s) Administered   Hepatitis B 04/17/2021    Influenza,inj,Quad PF,6+ Mos 07/25/2019   Influenza-Unspecified 07/25/2019, 04/02/2021   Moderna Covid-19 Fall Seasonal Vaccine 82yrs & older 09/19/2022, 06/19/2023   PFIZER(Purple Top)SARS-COV-2 Vaccination 12/03/2019, 12/28/2019   PPD Test 04/02/2021   Tdap 04/02/2021, 07/16/2023    The following portions of the patient's history were reviewed and updated as appropriate: allergies, current medications, past medical history, past social history, past surgical history and problem list.  Objective:  There were no vitals filed for this visit.  Physical Exam Vitals and nursing note reviewed.  Constitutional:      Appearance: Normal appearance.  HENT:     Head: Normocephalic and atraumatic.     Comments: No nits or hair loss of scalp, brows, and lashes    Mouth/Throat:     Mouth: Mucous membranes are moist.     Pharynx: Oropharynx is clear. No oropharyngeal exudate or posterior oropharyngeal erythema.  Eyes:     General:        Right eye: No discharge.        Left eye: No discharge.     Conjunctiva/sclera: Conjunctivae normal.  Right eye: Right conjunctiva is not injected. No exudate.    Left eye: Left conjunctiva is not injected. No exudate. Pulmonary:     Effort: Pulmonary effort is normal.  Abdominal:     Palpations: There is no hepatomegaly.     Tenderness: There is no abdominal tenderness. There is no rebound.  Genitourinary:    Comments: Politely declined genital exam. Lymphadenopathy:     Cervical: No cervical adenopathy.     Upper Body:     Right upper body: No supraclavicular adenopathy.     Left upper body: No supraclavicular adenopathy.     Comments: Patient declines genital exam. Inguinal lymph nodes not assessed.   Skin:    General: Skin is warm and dry.     Findings: No lesion or rash.  Neurological:     Mental Status: He is alert and oriented to person, place, and time.    Assessment and Plan:  Carlos Stewart is a 39 y.o. male presenting to the [facility] Department for STI screening.  Patient accepted the following screenings: oral GC culture, urine CT/GC, HIV, and RPR  1. Screening for venereal disease (Primary)  - Chlamydia/GC NAA, Confirmation - HIV [facility] - Syphilis Serology,  Lab - Gonococcus culture   Counseling: Recommended condom use with all sex Discussed importance of condom use for STI prevention Discussed time line for State Lab results and that patient will be called with positive results and encouraged patient to call if they had not heard in 2 weeks Recommended repeat testing in 3 months with positive results. Recommended returning for continued or worsening symptoms.   Return for STI testing as needed.  Future Appointments  Date Time Provider Department Center  10/21/2024  8:00 AM [facility] None  12/13/2024  8:00 AM Hisada, Katheren, MD [facility] None   Damien FORBES Satchel, NP "

## 2024-10-20 NOTE — Progress Notes (Signed)
 In nurse clinic for immunization today.Pt is wanting to get HPV vaccine. Insurance carrier is not within network and does not cover cost of vaccine. Pt is not currrently working and has no income at this time. Applied for [employer] PAP and faxed info to [employer] this afternoon. Advised pt once heard back from [employer] if received approval/denial would call patient.. Pt in agreement .

## 2024-10-20 NOTE — Patient Instructions (Signed)
 Continue venlafaxine  225 mg daily  Increase Vyvanse  40 mg daily  Hold Adderall Obtain urine screening Next appointment:  3/3 at 8 am

## 2024-10-21 ENCOUNTER — Other Ambulatory Visit

## 2024-10-21 DIAGNOSIS — R7989 Other specified abnormal findings of blood chemistry: Secondary | ICD-10-CM

## 2024-10-21 NOTE — Progress Notes (Signed)
 Received call back from [employer] this am . [employer] PAP for HPV vaccine was denied. Phone call to pt today to advise that [employer] PAP did not approve request for HPV vaccine d/t pt having insurance [facility]. [facility] is not in network with [facility]. Encouraged pt to contact [facility] for providers/pharmacies in network. Pt appreciative for call .

## 2024-10-23 LAB — CHLAMYDIA/GC NAA, CONFIRMATION
Chlamydia trachomatis, NAA: NEGATIVE
Neisseria gonorrhoeae, NAA: NEGATIVE

## 2024-10-24 LAB — GONOCOCCUS CULTURE

## 2024-10-25 LAB — TESTOSTERONE: Testosterone: 291 ng/dL (ref 264–916)

## 2024-10-27 ENCOUNTER — Ambulatory Visit

## 2024-10-28 ENCOUNTER — Ambulatory Visit: Payer: Self-pay | Admitting: Urology

## 2024-12-13 ENCOUNTER — Ambulatory Visit: Admitting: Psychiatry
